# Patient Record
Sex: Male | Born: 1950 | Race: Black or African American | Hispanic: No | Marital: Single | Smoking: Former smoker
Health system: Southern US, Community
[De-identification: ages and names within clinical notes are randomized; demographics above are authoritative.]

## PROBLEM LIST (undated history)

## (undated) DIAGNOSIS — C229 Malignant neoplasm of liver, not specified as primary or secondary: Secondary | ICD-10-CM

---

## 2011-06-25 ENCOUNTER — Ambulatory Visit: Payer: Self-pay | Admitting: Adult Health

## 2011-07-04 ENCOUNTER — Inpatient Hospital Stay: Payer: Self-pay | Admitting: Internal Medicine

## 2011-07-26 ENCOUNTER — Inpatient Hospital Stay: Payer: Self-pay | Admitting: Internal Medicine

## 2012-02-24 ENCOUNTER — Ambulatory Visit: Payer: Self-pay

## 2012-04-24 IMAGING — CR DG CHEST 2V
1 series · 2 of 2 positions shown · non-contrast
Comparison: none

REASON FOR EXAM: cva sx
COMMENTS:

[Series 1: w chest pa · 0.14mm/px · 2 of 2 slices shown]
[im 1/2]
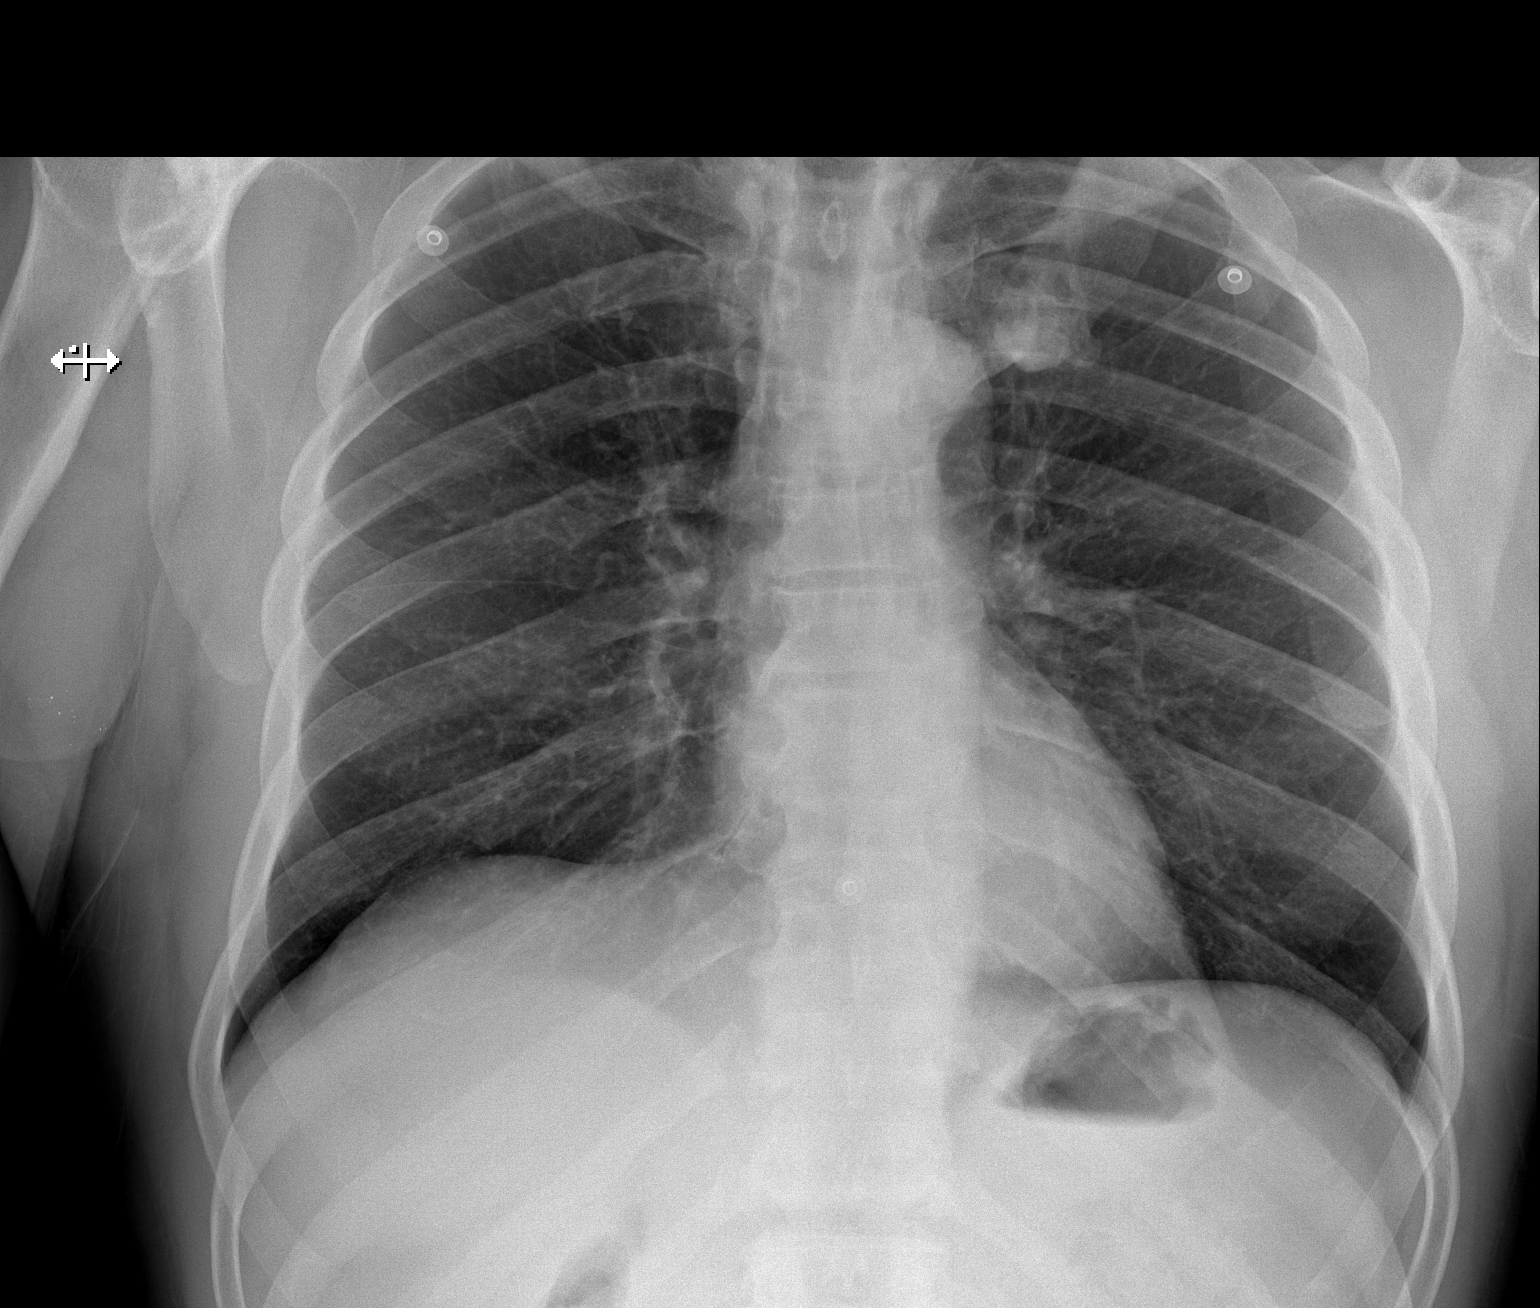
[im 2/2]
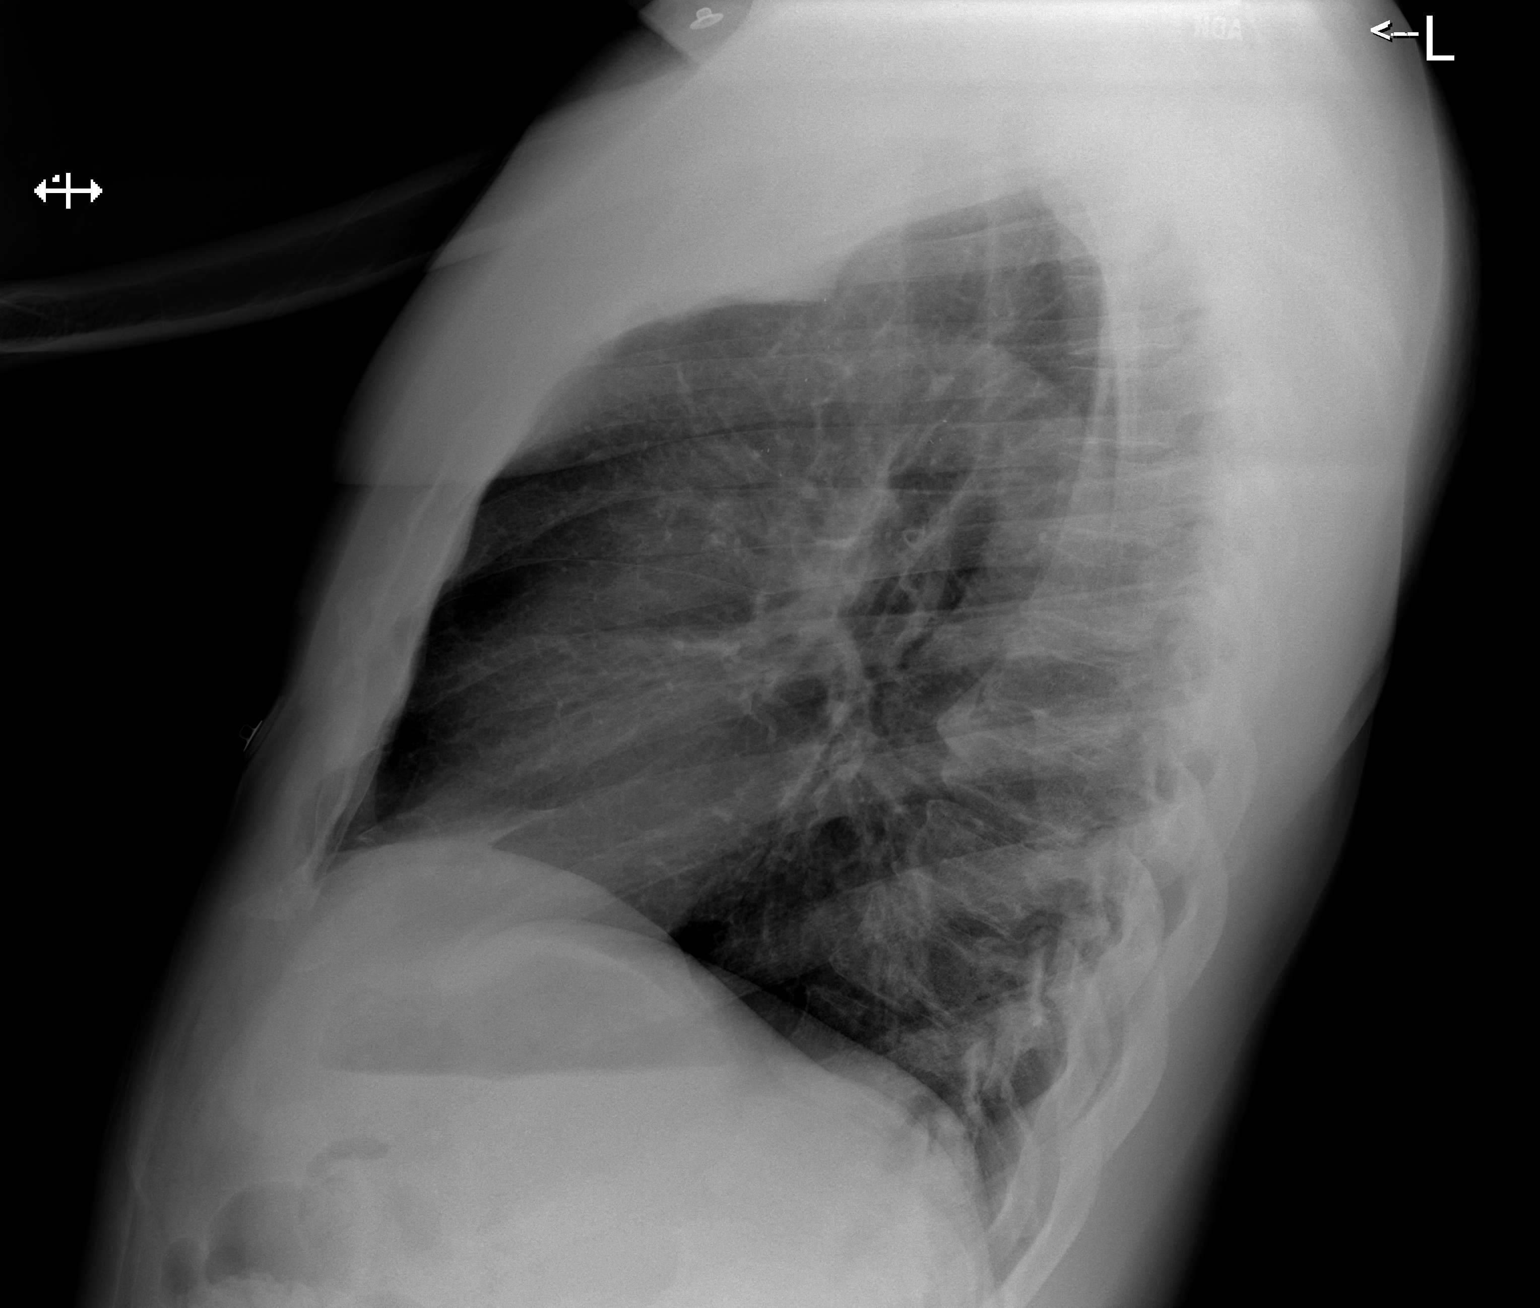

[2 of 2 positions shown; findings below may reference images not displayed]

PROCEDURE:     DXR - DXR CHEST PA (OR AP) AND LATERAL  - July 26, 2011  [DATE]

RESULT:     Comparison is made to the prior exam of 06/25/2011. The lung
fields are clear. No pneumonia, pneumothorax or pleural effusion is seen.
Heart size is normal. The mediastinal and osseous structures show no acute
changes.
IMPRESSION: 1.     No acute changes are identified.

## 2015-02-15 ENCOUNTER — Emergency Department: Payer: Medicaid Other

## 2015-02-15 ENCOUNTER — Inpatient Hospital Stay: Admit: 2015-02-15 | Payer: Medicaid Other

## 2015-02-15 ENCOUNTER — Inpatient Hospital Stay
Admission: EM | Admit: 2015-02-15 | Discharge: 2015-02-19 | DRG: 871 | Disposition: A | Discharge status: Hospice | Payer: Medicaid Other | Attending: Internal Medicine | Admitting: Internal Medicine

## 2015-02-15 ENCOUNTER — Encounter: Payer: Self-pay | Admitting: *Deleted

## 2015-02-15 DIAGNOSIS — C220 Liver cell carcinoma: Secondary | ICD-10-CM | POA: Diagnosis present

## 2015-02-15 DIAGNOSIS — W19XXXA Unspecified fall, initial encounter: Secondary | ICD-10-CM | POA: Diagnosis present

## 2015-02-15 DIAGNOSIS — D638 Anemia in other chronic diseases classified elsewhere: Secondary | ICD-10-CM | POA: Diagnosis present

## 2015-02-15 DIAGNOSIS — Z66 Do not resuscitate: Secondary | ICD-10-CM | POA: Diagnosis present

## 2015-02-15 DIAGNOSIS — B961 Klebsiella pneumoniae [K. pneumoniae] as the cause of diseases classified elsewhere: Secondary | ICD-10-CM | POA: Diagnosis present

## 2015-02-15 DIAGNOSIS — K7460 Unspecified cirrhosis of liver: Secondary | ICD-10-CM | POA: Diagnosis present

## 2015-02-15 DIAGNOSIS — K7682 Hepatic encephalopathy: Secondary | ICD-10-CM

## 2015-02-15 DIAGNOSIS — R188 Other ascites: Secondary | ICD-10-CM | POA: Diagnosis present

## 2015-02-15 DIAGNOSIS — E872 Acidosis: Secondary | ICD-10-CM | POA: Diagnosis present

## 2015-02-15 DIAGNOSIS — Z515 Encounter for palliative care: Secondary | ICD-10-CM | POA: Diagnosis not present

## 2015-02-15 DIAGNOSIS — E876 Hypokalemia: Secondary | ICD-10-CM | POA: Diagnosis present

## 2015-02-15 DIAGNOSIS — E119 Type 2 diabetes mellitus without complications: Secondary | ICD-10-CM | POA: Diagnosis present

## 2015-02-15 DIAGNOSIS — Z8505 Personal history of malignant neoplasm of liver: Secondary | ICD-10-CM

## 2015-02-15 DIAGNOSIS — K766 Portal hypertension: Secondary | ICD-10-CM | POA: Diagnosis present

## 2015-02-15 DIAGNOSIS — N390 Urinary tract infection, site not specified: Secondary | ICD-10-CM | POA: Diagnosis present

## 2015-02-15 DIAGNOSIS — Z87891 Personal history of nicotine dependence: Secondary | ICD-10-CM | POA: Diagnosis not present

## 2015-02-15 DIAGNOSIS — K7290 Hepatic failure, unspecified without coma: Secondary | ICD-10-CM | POA: Diagnosis present

## 2015-02-15 DIAGNOSIS — K652 Spontaneous bacterial peritonitis: Secondary | ICD-10-CM | POA: Diagnosis present

## 2015-02-15 DIAGNOSIS — A047 Enterocolitis due to Clostridium difficile: Secondary | ICD-10-CM | POA: Diagnosis present

## 2015-02-15 DIAGNOSIS — E871 Hypo-osmolality and hyponatremia: Secondary | ICD-10-CM | POA: Diagnosis present

## 2015-02-15 DIAGNOSIS — R161 Splenomegaly, not elsewhere classified: Secondary | ICD-10-CM | POA: Diagnosis present

## 2015-02-15 DIAGNOSIS — A419 Sepsis, unspecified organism: Secondary | ICD-10-CM | POA: Diagnosis present

## 2015-02-15 DIAGNOSIS — E440 Moderate protein-calorie malnutrition: Secondary | ICD-10-CM | POA: Insufficient documentation

## 2015-02-15 DIAGNOSIS — I81 Portal vein thrombosis: Secondary | ICD-10-CM | POA: Diagnosis present

## 2015-02-15 HISTORY — DX: Malignant neoplasm of liver, not specified as primary or secondary: C22.9

## 2015-02-15 LAB — URINALYSIS COMPLETE WITH MICROSCOPIC (ARMC ONLY)
GLUCOSE, UA: NEGATIVE mg/dL
Ketones, ur: NEGATIVE mg/dL
NITRITE: NEGATIVE
Protein, ur: 30 mg/dL — AB
Specific Gravity, Urine: 1.024 (ref 1.005–1.030)
pH: 5 (ref 5.0–8.0)

## 2015-02-15 LAB — COMPREHENSIVE METABOLIC PANEL
ALBUMIN: 1.9 g/dL — AB (ref 3.5–5.0)
ALK PHOS: 260 U/L — AB (ref 38–126)
ALT: 29 U/L (ref 17–63)
AST: 164 U/L — ABNORMAL HIGH (ref 15–41)
Anion gap: 12 (ref 5–15)
BUN: 14 mg/dL (ref 6–20)
CALCIUM: 8.5 mg/dL — AB (ref 8.9–10.3)
CO2: 17 mmol/L — AB (ref 22–32)
CREATININE: 0.88 mg/dL (ref 0.61–1.24)
Chloride: 98 mmol/L — ABNORMAL LOW (ref 101–111)
GFR calc Af Amer: >60 mL/min (ref >60)
GFR calc non Af Amer: >60 mL/min (ref >60)
GLUCOSE: 120 mg/dL — AB (ref 65–99)
POTASSIUM: 4.7 mmol/L (ref 3.5–5.1)
Sodium: 127 mmol/L — ABNORMAL LOW (ref 135–145)
Total Bilirubin: 9.8 mg/dL — ABNORMAL HIGH (ref 0.3–1.2)
Total Protein: 9.4 g/dL — ABNORMAL HIGH (ref 6.5–8.1)

## 2015-02-15 LAB — CBC
HCT: 30.7 % — ABNORMAL LOW (ref 40.0–52.0)
Hemoglobin: 10.3 g/dL — ABNORMAL LOW (ref 13.0–18.0)
MCH: 31.4 pg (ref 26.0–34.0)
MCHC: 33.5 g/dL (ref 32.0–36.0)
MCV: 93.6 fL (ref 80.0–100.0)
Platelets: 242 10*3/uL (ref 150–440)
RBC: 3.28 MIL/uL — ABNORMAL LOW (ref 4.40–5.90)
RDW: 18.5 % — AB (ref 11.5–14.5)
WBC: 19.2 10*3/uL — ABNORMAL HIGH (ref 3.8–10.6)

## 2015-02-15 LAB — TROPONIN I
TROPONIN I: 0.11 ng/mL — AB (ref <0.031)
Troponin I: 0.12 ng/mL — ABNORMAL HIGH (ref <0.031)

## 2015-02-15 LAB — AMMONIA: AMMONIA: 54 umol/L — AB (ref 9–35)

## 2015-02-15 LAB — LACTIC ACID, PLASMA
LACTIC ACID, VENOUS: 4 mmol/L — AB (ref 0.5–2.0)
Lactic Acid, Venous: 5 mmol/L (ref 0.5–2.0)

## 2015-02-15 LAB — CLOSTRIDIUM DIFFICILE BY PCR: Toxigenic C. Difficile by PCR: POSITIVE — AB

## 2015-02-15 MED ORDER — SODIUM CHLORIDE 0.9 % IV BOLUS (SEPSIS)
1000.0000 mL | Freq: Once | INTRAVENOUS | Status: AC
  Administered 2015-02-15: 1000 mL via INTRAVENOUS

## 2015-02-15 MED ORDER — ENOXAPARIN SODIUM 40 MG/0.4ML ~~LOC~~ SOLN
40.0000 mg | SUBCUTANEOUS | Status: DC
  Administered 2015-02-15 – 2015-02-16 (×2): 40 mg via SUBCUTANEOUS
  Filled 2015-02-15 (×2): qty 0.4

## 2015-02-15 MED ORDER — LACTULOSE 10 GM/15ML PO SOLN
ORAL | Status: AC
  Administered 2015-02-15: 20 g via ORAL
  Filled 2015-02-15: qty 30

## 2015-02-15 MED ORDER — PIPERACILLIN-TAZOBACTAM 3.375 G IVPB 30 MIN: 3.3750 g | Freq: Four times a day (QID) | INTRAVENOUS | Status: DC

## 2015-02-15 MED ORDER — ONDANSETRON HCL 4 MG/2ML IJ SOLN: 4.0000 mg | Freq: Four times a day (QID) | INTRAMUSCULAR | Status: DC | PRN

## 2015-02-15 MED ORDER — VANCOMYCIN HCL IN DEXTROSE 1-5 GM/200ML-% IV SOLN
INTRAVENOUS | Status: AC
  Filled 2015-02-15: qty 200

## 2015-02-15 MED ORDER — VANCOMYCIN HCL IN DEXTROSE 1-5 GM/200ML-% IV SOLN
1000.0000 mg | Freq: Once | INTRAVENOUS | Status: AC
  Administered 2015-02-15: 1000 mg via INTRAVENOUS
  Filled 2015-02-15: qty 200

## 2015-02-15 MED ORDER — VANCOMYCIN HCL IN DEXTROSE 1-5 GM/200ML-% IV SOLN
1000.0000 mg | Freq: Once | INTRAVENOUS | Status: AC
  Administered 2015-02-15: 1000 mg via INTRAVENOUS

## 2015-02-15 MED ORDER — VANCOMYCIN HCL IN DEXTROSE 1-5 GM/200ML-% IV SOLN: 1000.0000 mg | INTRAVENOUS | Status: DC

## 2015-02-15 MED ORDER — VANCOMYCIN HCL IN DEXTROSE 1-5 GM/200ML-% IV SOLN
1000.0000 mg | Freq: Three times a day (TID) | INTRAVENOUS | Status: DC
  Administered 2015-02-16 (×2): 1000 mg via INTRAVENOUS
  Filled 2015-02-15 (×6): qty 200

## 2015-02-15 MED ORDER — PIPERACILLIN-TAZOBACTAM 3.375 G IVPB
3.3750 g | Freq: Once | INTRAVENOUS | Status: AC
  Administered 2015-02-15: 3.375 g via INTRAVENOUS

## 2015-02-15 MED ORDER — SENNOSIDES-DOCUSATE SODIUM 8.6-50 MG PO TABS: 1.0000 | ORAL_TABLET | Freq: Every evening | ORAL | Status: DC | PRN

## 2015-02-15 MED ORDER — ACETAMINOPHEN 325 MG PO TABS: 650.0000 mg | ORAL_TABLET | Freq: Four times a day (QID) | ORAL | Status: DC | PRN

## 2015-02-15 MED ORDER — LACTULOSE 10 GM/15ML PO SOLN
20.0000 g | Freq: Three times a day (TID) | ORAL | Status: DC
  Administered 2015-02-15 (×2): 20 g via ORAL
  Filled 2015-02-15: qty 30

## 2015-02-15 MED ORDER — SODIUM CHLORIDE 0.9 % IV SOLN
1000.0000 mL | Freq: Once | INTRAVENOUS | Status: AC
  Administered 2015-02-15: 1000 mL via INTRAVENOUS

## 2015-02-15 MED ORDER — NADOLOL 20 MG PO TABS
20.0000 mg | ORAL_TABLET | Freq: Every day | ORAL | Status: DC
  Administered 2015-02-15 – 2015-02-19 (×5): 20 mg via ORAL
  Filled 2015-02-15 (×5): qty 1

## 2015-02-15 MED ORDER — ACETAMINOPHEN 650 MG RE SUPP: 650.0000 mg | Freq: Four times a day (QID) | RECTAL | Status: DC | PRN

## 2015-02-15 MED ORDER — PIPERACILLIN-TAZOBACTAM 3.375 G IVPB
INTRAVENOUS | Status: AC
  Filled 2015-02-15: qty 50

## 2015-02-15 MED ORDER — ONDANSETRON HCL 4 MG PO TABS: 4.0000 mg | ORAL_TABLET | Freq: Four times a day (QID) | ORAL | Status: DC | PRN

## 2015-02-15 MED ORDER — PIPERACILLIN-TAZOBACTAM 3.375 G IVPB
3.3750 g | Freq: Three times a day (TID) | INTRAVENOUS | Status: DC
  Administered 2015-02-15 – 2015-02-17 (×5): 3.375 g via INTRAVENOUS
  Filled 2015-02-15 (×10): qty 50

## 2015-02-15 MED ORDER — SODIUM CHLORIDE 0.9 % IV SOLN
INTRAVENOUS | Status: AC
  Administered 2015-02-15 – 2015-02-16 (×3): via INTRAVENOUS

## 2015-02-15 NOTE — Progress Notes (Signed)
Patient admitted to unit. Oriented to room, call bell, and staff. Bed in lowest position. Fall safety plan reviewed. Focused assessment to Epic due to change of  Shift time. No complaints at this time. Family present for admission questions.  David Stokes

## 2015-02-15 NOTE — ED Notes (Signed)
Patient transported to X-ray, family waiting in room

## 2015-02-15 NOTE — ED Notes (Signed)
Patient refused In and out cath for urine specimen. Urinal at bedside.

## 2015-02-15 NOTE — ED Notes (Signed)
Condom cath placed. Patient tolerated procedure well.

## 2015-02-15 NOTE — Progress Notes (Addendum)
ANTIBIOTIC CONSULT NOTE - INITIAL  Pharmacy Consult for Vancomycin and Zosyn dosing  Indication: Sepsis  No Known Allergies  Patient Measurements: Height: 6' (182.9 cm) Weight: 180 lb (81.647 kg) IBW/kg (Calculated) : 77.6 Adjusted Body Weight: 79.2 kg  Vital Signs: Temp: 98.3 F (36.8 C) (05/20 1830) Temp Source: Oral (05/20 1830) BP: 135/72 mmHg (05/20 1830) Pulse Rate: 80 (05/20 1830) Intake/Output from previous day:   Intake/Output from this shift:    Labs:  Recent Labs  02/15/15 1005  WBC 19.2*  HGB 10.3*  PLT 242  CREATININE 0.88   Estimated Creatinine Clearance: 95.5 mL/min (by C-G formula based on Cr of 0.88). No results for input(s): VANCOTROUGH, VANCOPEAK, VANCORANDOM, GENTTROUGH, GENTPEAK, GENTRANDOM, TOBRATROUGH, TOBRAPEAK, TOBRARND, AMIKACINPEAK, AMIKACINTROU, AMIKACIN in the last 72 hours.   Microbiology: No results found for this or any previous visit (from the past 720 hour(s)).  Medical History: Past Medical History  Diagnosis Date  . Liver cancer     Medications:  Anti-infectives    Start     Dose/Rate Route Frequency Ordered Stop   02/16/15 0600  vancomycin (VANCOCIN) IVPB 1000 mg/200 mL premix     1,000 mg 200 mL/hr over 60 Minutes Intravenous Every 8 hours 02/15/15 1840     02/15/15 2200  vancomycin (VANCOCIN) IVPB 1000 mg/200 mL premix     1,000 mg 200 mL/hr over 60 Minutes Intravenous  Once 02/15/15 1840     02/15/15 2100  piperacillin-tazobactam (ZOSYN) IVPB 3.375 g     3.375 g 12.5 mL/hr over 240 Minutes Intravenous 3 times per day 02/15/15 1843     02/15/15 1845  vancomycin (VANCOCIN) IVPB 1000 mg/200 mL premix  Status:  Discontinued     1,000 mg 200 mL/hr over 60 Minutes Intravenous Every 24 hours 02/15/15 1830 02/15/15 1838   02/15/15 1845  piperacillin-tazobactam (ZOSYN) IVPB 3.375 g  Status:  Discontinued     3.375 g 100 mL/hr over 30 Minutes Intravenous 4 times per day 02/15/15 1830 02/15/15 1841   02/15/15 1200   vancomycin (VANCOCIN) IVPB 1000 mg/200 mL premix     1,000 mg 200 mL/hr over 60 Minutes Intravenous  Once 02/15/15 1159 02/15/15 1702   02/15/15 1200  piperacillin-tazobactam (ZOSYN) IVPB 3.375 g     3.375 g 12.5 mL/hr over 240 Minutes Intravenous  Once 02/15/15 1159 02/15/15 1953     Assessment: 64 year old male ordered Vancomycin and Zosyn for Sepsis.    Goal of Therapy:  Vancomycin trough level 15-20 mcg/ml  Plan:  Measure antibiotic drug levels at steady state Follow up culture results Vancomycin 1g given in ED ~ 16:00.  Ordered next dose for 5/20 at 22:00 for stacked dosing. Continue with Vancomycin 1g IV Q8H beginning 5/21 at 0600.    Vancomycin Trough ordered for 5/21 at 21:30.  Zosyn 3.375g EI Q8H ordered.    Katsoudas,Christine K, RPh 02/15/2015,6:45 PM   5/21 PM Vanc level  23. Changed to 1 gram q 12 hours. Level ordered before 3rd new dose to evaluate regimen.  Sim Boast, PharmD, BCPS  02/16/2015

## 2015-02-15 NOTE — Progress Notes (Signed)
Patient's  Stool came positive for C'diff and contact precaution initiated, MD aware. Lactic acid also came in 4.0 and DR. Konidena notified of the results, no new order.
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
## 2015-02-15 NOTE — ED Provider Notes (Signed)
[facility] Emergency Department Provider Note  ____________________________________________  Time seen: 9:50 AM  I have reviewed the triage vital signs and the nursing notes.   HISTORY  Chief Complaint Fall  Slow to respond but seems to answer appropriately    HPI David Stokes is a 64 y.o. male who presents after a fall. Per EMS patient was walking he got dizzy and fell forward but denies hitting his head. He initially complained of left upper leg pain but is able to move his left leg well and says it feels better now. Relatively recent diagnosis of liver CA for which she has not started treatment. He denies fevers chills. Denies cough area no abdominal pain.     No past medical history on file.  There are no active problems to display for this patient.   No past surgical history on file.  No current outpatient prescriptions on file.  Allergies Review of patient's allergies indicates not on file.  No family history on file.  Social History History  Substance Use Topics  . Smoking status: Not on file  . Smokeless tobacco: Not on file  . Alcohol Use: Not on file    Review of Systems  Constitutional: Negative for fever. Eyes: Negative for visual changes. ENT: Negative for sore throat. Cardiovascular: Negative for chest pain. Respiratory: Negative for shortness of breath. Gastrointestinal: Negative for abdominal pain, vomiting and diarrhea. Genitourinary: Negative for dysuria. Musculoskeletal: Negative for back pain. Skin: Negative for rash. Neurological: Negative for headaches, focal weakness or numbness.   10-point ROS otherwise negative.  ____________________________________________   PHYSICAL EXAM:  VITAL SIGNS: ED Triage Vitals  Enc Vitals Group     BP 02/15/15 0957 154/115 mmHg     Pulse Rate 02/15/15 0957 105     Resp 02/15/15 0957 20     Temp 02/15/15 0957 99.3 F (37.4 C)     Temp src --      SpO2 02/15/15  0957 99 %     Weight 02/15/15 0957 180 lb (81.647 kg)     Height 02/15/15 0957 6' (1.829 m)     Head Cir --      Peak Flow --      Pain Score --      Pain Loc --      Pain Edu? --      Excl. in [place]? --      Constitutional: Alert and oriented. Well appearing and in no distress. Eyes: Conjunctivae are icteric. PERRL. Normal extraocular movements. ENT   Head: Normocephalic and atraumatic.   Nose: No congestion/rhinnorhea.   Mouth/Throat: Mucous membranes are moist.  Hematological/Lymphatic/Immunilogical: No cervical lymphadenopathy. Cardiovascular: tachycardia ,regular rhythm. Normal and symmetric distal pulses are present in all extremities. No murmurs, rubs, or gallops. Respiratory: Normal respiratory effort without tachypnea nor retractions. Breath sounds are clear and equal bilaterally. No wheezes/rales/rhonchi. Gastrointestinal: Soft and nontender. No distention. There is no CVA tenderness. Genitourinary: deferred Musculoskeletal: Nontender with normal range of motion in all extremities. No joint effusions.  No lower extremity tenderness nor edema. Able to range both extremities very well Neurologic:  Normal speech and language. No gross focal neurologic deficits are appreciated. Speech is normal.  Skin:  Skin is warm, dry and intact. No rash noted. Psychiatric: Mood and affect are normal. Speech and behavior are normal. Patient exhibits appropriate insight and judgment.  ____________________________________________    LABS (pertinent positives/negatives)  Significant for elevated ammonia and elevated white blood cell count and hyponatremia and  elevated troponin  ____________________________________________   EKG  ED ECG REPORT I, Lavonia Drafts, the attending physician, personally viewed and interpreted this ECG.   Date: 02/15/2015  EKG Time: 2:30 PM  Rate: 102  Rhythm: sinus tachycardia  Axis: Normal  Intervals:none  ST&T Change:  Nonspecific   ____________________________________________    RADIOLOGY None  ____________________________________________   PROCEDURES  Procedure(s) performed: None  Critical Care performed: None  ____________________________________________   INITIAL IMPRESSION / ASSESSMENT AND PLAN / ED COURSE  Pertinent labs & imaging results that were available during my care of the patient were reviewed by me and considered in my medical decision making (see chart for details).  On arrival patient ultimately well-appearing. No acute injury except for some mild pelvic discomfort. However labs concerning for SIRS versus sepsis. Unable to establish clear source of infection however her elevated lactate elevated white blood cell count is very concerning and hence I have covered him broadly with vancomycin and Zosyn. I will admit him to the hospitalist service for further evaluation  ____________________________________________   FINAL CLINICAL IMPRESSION(S) / ED DIAGNOSES  Final diagnoses:  Sepsis, due to unspecified organism     Lavonia Drafts, MD 02/15/15 1451

## 2015-02-15 NOTE — ED Notes (Signed)
Pt returned from xray, resting in bed in no distress

## 2015-02-15 NOTE — ED Notes (Signed)
Pt arrives via EMS with a fall, pt states he got dizzy and weak and fell, pt recently diagnosed with liver cancer but hasn't received his first tx yet, pt complains of left upper thigh pain

## 2015-02-15 NOTE — ED Notes (Signed)
Lovena Le RN called and asked to hold patient while room is readied by maintenance.

## 2015-02-15 NOTE — ED Notes (Signed)
Report called to [employer] on floor.

## 2015-02-16 ENCOUNTER — Inpatient Hospital Stay
Admit: 2015-02-16 | Discharge: 2015-02-16 | Disposition: A | Discharge status: Home / Self Care | Payer: Medicaid Other | Attending: Internal Medicine | Admitting: Internal Medicine

## 2015-02-16 DIAGNOSIS — E440 Moderate protein-calorie malnutrition: Secondary | ICD-10-CM | POA: Insufficient documentation

## 2015-02-16 LAB — VANCOMYCIN, TROUGH: Vancomycin Tr: 23 ug/mL (ref 10–20)

## 2015-02-16 LAB — CBC
HCT: 24.1 % — ABNORMAL LOW (ref 40.0–52.0)
HCT: 25.6 % — ABNORMAL LOW (ref 40.0–52.0)
Hemoglobin: 8.2 g/dL — ABNORMAL LOW (ref 13.0–18.0)
Hemoglobin: 8.6 g/dL — ABNORMAL LOW (ref 13.0–18.0)
MCH: 31.6 pg (ref 26.0–34.0)
MCH: 31.2 pg (ref 26.0–34.0)
MCHC: 33.5 g/dL (ref 32.0–36.0)
MCHC: 33.9 g/dL (ref 32.0–36.0)
MCV: 93.2 fL (ref 80.0–100.0)
MCV: 93.2 fL (ref 80.0–100.0)
Platelets: 159 10*3/uL (ref 150–440)
Platelets: 160 10*3/uL (ref 150–440)
RBC: 2.58 MIL/uL — AB (ref 4.40–5.90)
RBC: 2.75 MIL/uL — ABNORMAL LOW (ref 4.40–5.90)
RDW: 18.7 % — ABNORMAL HIGH (ref 11.5–14.5)
RDW: 18.9 % — ABNORMAL HIGH (ref 11.5–14.5)
WBC: 13.3 10*3/uL — ABNORMAL HIGH (ref 3.8–10.6)
WBC: 14.6 10*3/uL — ABNORMAL HIGH (ref 3.8–10.6)

## 2015-02-16 LAB — HEPATIC FUNCTION PANEL
ALBUMIN: 1.5 g/dL — AB (ref 3.5–5.0)
ALT: 24 U/L (ref 17–63)
AST: 128 U/L — ABNORMAL HIGH (ref 15–41)
Alkaline Phosphatase: 199 U/L — ABNORMAL HIGH (ref 38–126)
Bilirubin, Direct: 5.1 mg/dL — ABNORMAL HIGH (ref 0.1–0.5)
Indirect Bilirubin: 2.9 mg/dL — ABNORMAL HIGH (ref 0.3–0.9)
Total Bilirubin: 8 mg/dL — ABNORMAL HIGH (ref 0.3–1.2)
Total Protein: 7.7 g/dL (ref 6.5–8.1)

## 2015-02-16 LAB — BASIC METABOLIC PANEL
ANION GAP: 4 — AB (ref 5–15)
BUN: 14 mg/dL (ref 6–20)
CHLORIDE: 105 mmol/L (ref 101–111)
CO2: 19 mmol/L — AB (ref 22–32)
CREATININE: 0.71 mg/dL (ref 0.61–1.24)
Calcium: 7.6 mg/dL — ABNORMAL LOW (ref 8.9–10.3)
GFR calc Af Amer: >60 mL/min (ref >60)
GLUCOSE: 107 mg/dL — AB (ref 65–99)
POTASSIUM: 4.5 mmol/L (ref 3.5–5.1)
SODIUM: 128 mmol/L — AB (ref 135–145)

## 2015-02-16 LAB — TROPONIN I
TROPONIN I: 0.1 ng/mL — AB (ref <0.031)
Troponin I: 0.09 ng/mL — ABNORMAL HIGH (ref <0.031)

## 2015-02-16 LAB — CREATININE, SERUM
Creatinine, Ser: 0.8 mg/dL (ref 0.61–1.24)
GFR calc Af Amer: >60 mL/min (ref >60)

## 2015-02-16 LAB — MAGNESIUM: MAGNESIUM: 1.7 mg/dL (ref 1.7–2.4)

## 2015-02-16 LAB — C DIFFICILE QUICK SCREEN W PCR REFLEX
C DIFFICLE (CDIFF) ANTIGEN: POSITIVE
C Diff toxin: NEGATIVE

## 2015-02-16 MED ORDER — ENSURE ENLIVE PO LIQD
237.0000 mL | Freq: Two times a day (BID) | ORAL | Status: DC
  Administered 2015-02-16 – 2015-02-18 (×5): 237 mL via ORAL

## 2015-02-16 MED ORDER — VANCOMYCIN HCL IN DEXTROSE 1-5 GM/200ML-% IV SOLN
1000.0000 mg | Freq: Two times a day (BID) | INTRAVENOUS | Status: DC
  Administered 2015-02-17: 1000 mg via INTRAVENOUS
  Filled 2015-02-16 (×2): qty 200

## 2015-02-16 MED ORDER — METRONIDAZOLE IN NACL 5-0.79 MG/ML-% IV SOLN
500.0000 mg | Freq: Four times a day (QID) | INTRAVENOUS | Status: DC
Start: 2015-02-16 — End: 2015-02-18
  Administered 2015-02-16 – 2015-02-18 (×10): 500 mg via INTRAVENOUS
  Filled 2015-02-16 (×13): qty 100

## 2015-02-16 NOTE — Evaluation (Signed)
Physical Therapy Evaluation Patient Details Name: David Stokes MRN: [id number] DOB: 06-24-1951 Today's Date: 02/16/2015   History of Present Illness  64 year old male patient with recently diagnosed With hepato Cellular cancer with jaundice, portal vein thrombosis comes in with fall, found to have clinical sepsis with lactic acid elevation up to 5, WBC 19. Patient is asymptomatic except burning urination.  Clinical Impression  Pt presents with decrease strength throughout effecting pt's ability to ambulate and maintain balance.  Pt required 1 person assist to get to bathroom and needed one person assist to maintain standing balance during hygiene.  Pt with poor standing endurance needing seated rest breaks due to muscle fatigue.  Pt incontinent of stool at this time and needs Max for changing gown and hygiene.  Pt has family members at home to assist with his care after discharge.  Rec HHPT.  Pt would benefit from acute care PT to address objective findings.    Follow Up Recommendations Home health PT    Equipment Recommendations  Rolling walker with 5" wheels    Recommendations for Other Services       Precautions / Restrictions Precautions Precautions: Fall Precaution Comments: weakness Restrictions Other Position/Activity Restrictions: enteric precautions c-diff positive      Mobility  Bed Mobility Overal bed mobility: Needs Assistance Bed Mobility: Supine to Sit;Sit to Supine     Supine to sit: Min guard;HOB elevated Sit to supine: Min guard;HOB elevated   General bed mobility comments: Slow, extra time needed.  Transfers Overall transfer level: Modified independent   Transfers: Sit to/from Stand Sit to Stand: Min guard            Ambulation/Gait Ambulation/Gait assistance: Min guard Ambulation Distance (Feet): 25 Feet Assistive device: 1 person hand held assist Gait Pattern/deviations: Step-through pattern;Wide base of support Gait velocity: slow    General Gait Details: Slow, labored gait with decreased balance, widened base of support  Stairs            Wheelchair Mobility    Modified Rankin (Stroke Patients Only)       Balance Overall balance assessment: Needs assistance Sitting-balance support: Single extremity supported Sitting balance-Leahy Scale: Good     Standing balance support: Single extremity supported Standing balance-Leahy Scale: Fair                               Pertinent Vitals/Pain Pain Assessment: No/denies pain    Home Living Family/patient expects to be discharged to:: Private residence Living Arrangements: Children Available Help at Discharge: Family Type of Home: House Home Access: Stairs to enter Entrance Stairs-Rails: Right Entrance Stairs-Number of Steps: 3 on walkway and 4 at the house Home Layout: One level Home Equipment: [place] - single point      Prior Function Level of Independence: Independent with assistive device(s)         Comments: Pt was able to walk outside the house prior to [facility] admission which was [facility].     Hand Dominance        Extremity/Trunk Assessment   Upper Extremity Assessment: Overall WFL for tasks assessed           Lower Extremity Assessment: Generalized weakness         Communication   Communication: No difficulties  Cognition Arousal/Alertness: Lethargic Behavior During Therapy: WFL for tasks assessed/performed;Flat affect Overall Cognitive Status: Within Functional Limits for tasks assessed  General Comments General comments (skin integrity, edema, etc.): jaundice    Exercises        Assessment/Plan    PT Assessment    PT Diagnosis Difficulty walking;Generalized weakness   PT Problem List    PT Treatment Interventions     PT Goals (Current goals can be found in the Care Plan section) Acute Rehab PT Goals Patient Stated Goal: Return home. PT Goal Formulation: With  patient Time For Goal Achievement: 02/23/15 Potential to Achieve Goals: Good    Frequency     Barriers to discharge        Co-evaluation               End of Session Equipment Utilized During Treatment: Gait belt Activity Tolerance: Patient limited by fatigue Patient left: in bed;with family/visitor present;with nursing/sitter in room           Time: 1435-1505 PT Time Calculation (min) (ACUTE ONLY): 30 min   Charges:   PT Evaluation $Initial PT Evaluation Tier I: 1 Procedure PT Treatments $Therapeutic Activity: 8-22 mins   PT G Codes:        Ulysess Witz A Zebedee Segundo 2015/03/05, 3:13 PM
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
## 2015-02-16 NOTE — Progress Notes (Signed)
Blood culture positive with 2 sets of aerobic and anaerobic bottle and gram negative rods. Dr. Lavetta Nielsen notified.

## 2015-02-16 NOTE — Progress Notes (Signed)
Initial Nutrition Assessment  DOCUMENTATION CODES:  Non-severe (moderate) malnutrition in context of chronic illness  INTERVENTION:   (Medical Nutrition Supplement: Recommend ensure enlive BID for added nutrition, pt has been drinking prior to admission)  NUTRITION DIAGNOSIS:  Inadequate oral intake related to acute illness as evidenced by other (see comment) (decreased intake for 2-3 days prior to admission).    GOAL:  Patient will meet greater than or equal to 90% of their needs    MONITOR:   (Energy intake, Digestive system)  REASON FOR ASSESSMENT:  Malnutrition Screening Tool    ASSESSMENT:  Pt admitted with sepsis, positive blood cultures, c-diff positive, recent fall  Past Medical History  Diagnosis Date  . Liver cancer    Electrolyte and Renal Profile:    Recent Labs Lab 02/15/15 1005 02/16/15 0001 02/16/15 0405  BUN 14  --  14  CREATININE 0.88 0.80 0.71  NA 127*  --  128*  K 4.7  --  4.5  MG  --   --  1.7   Protein Profile:   Recent Labs Lab 02/15/15 1005 02/16/15 0001  ALBUMIN 1.9* 1.5*   Medications: NS at 145ml/hr  Pt reports intake has been down for the past 2-3 days prior to admission.  Ate few bites of eggs and coffee this am for breakfast. Has been drinking ensure prior to admission    Height:  Ht Readings from Last 1 Encounters:  02/15/15 6' (1.829 m)    Weight:  Wt Readings from Last 1 Encounters:  02/16/15 180 lb 12.4 oz (82 kg)    Pt unsure of any weight loss. Reports UBW of 197 pounds but unsure when he was last at that weight.      Wt Readings from Last 10 Encounters:  02/16/15 180 lb 12.4 oz (82 kg)    Nutrition-Focused physical exam completed. Findings are most areas mild/moderate fat depletion, most areas mild/moderate muscle depletion, and no edema.     BMI:  Body mass index is 24.51 kg/(m^2).  Estimated Nutritional Needs:  Kcal:  BEE 1648 kcals (IF 1.0-1.3, AF 1.2) 8338-2505 kcals/d  Protein:   (1.2-1.5 g/d) 98-123 g/d  Fluid:  (25-84ml/kg) 2050-241ml/d  Skin:  Reviewed, no issues  Diet Order:  Diet regular Room service appropriate?: Yes; Fluid consistency:: Thin  EDUCATION NEEDS:  No education needs identified at this time   Intake/Output Summary (Last 24 hours) at 02/16/15 1152 Last data filed at 02/16/15 0830  Gross per 24 hour  Intake   1650 ml  Output      0 ml  Net   1650 ml    Last BM:  5/20, c-diff postive  MODERATE Care Level Hayla Hinger B. Zenia Resides, [place], [place] (pager)

## 2015-02-16 NOTE — Care Management Note (Signed)
Case Management Note  Patient Details  Name: David Stokes MRN: [id number] Date of Birth:             CM spoke with adult daughter David Stokes about discharge planning, and that PT has recommended home health PT. Discussed list of local providers and [facility] chose [employer]. Monica confirmed that David Stokes would be returning to his home after this hospital discharge.     Expected Discharge Date:                  Expected Discharge Plan:  [place]  In-House Referral:     Discharge planning Services  CM Consult  Post Acute Care Choice:  Home Health Choice offered to:  Adult Children, Patient  DME Arranged:    DME Agency:     HH Arranged:  PT HH Agency:  [place]  Status of Service:  In process, will continue to follow  Medicare Important Message Given:    Date Medicare IM Given:    Medicare IM give by:    Date Additional Medicare IM Given:    Additional Medicare Important Message give by:     If discussed at [place] of Stay Meetings, dates discussed:    Additional Comments:  David Saari A, RN 02/16/2015, 3:56 PM

## 2015-02-16 NOTE — Progress Notes (Signed)
*  PRELIMINARY RESULTS* Echocardiogram 2D Echocardiogram has been performed.  David Stokes 02/16/2015, 10:15 AM

## 2015-02-17 LAB — URINE CULTURE

## 2015-02-17 LAB — CULTURE, BLOOD (ROUTINE X 2)

## 2015-02-17 LAB — CBC
HCT: 23.8 % — ABNORMAL LOW (ref 40.0–52.0)
Hemoglobin: 8 g/dL — ABNORMAL LOW (ref 13.0–18.0)
MCH: 31.1 pg (ref 26.0–34.0)
MCHC: 33.4 g/dL (ref 32.0–36.0)
MCV: 93 fL (ref 80.0–100.0)
Platelets: 166 10*3/uL (ref 150–440)
RBC: 2.56 MIL/uL — ABNORMAL LOW (ref 4.40–5.90)
RDW: 17.8 % — ABNORMAL HIGH (ref 11.5–14.5)
WBC: 11.9 10*3/uL — ABNORMAL HIGH (ref 3.8–10.6)

## 2015-02-17 MED ORDER — CIPROFLOXACIN IN D5W 400 MG/200ML IV SOLN
400.0000 mg | Freq: Two times a day (BID) | INTRAVENOUS | Status: DC
  Administered 2015-02-17 – 2015-02-18 (×3): 400 mg via INTRAVENOUS
  Filled 2015-02-17 (×5): qty 200

## 2015-02-17 NOTE — Progress Notes (Addendum)
ANTIBIOTIC CONSULT NOTE - INITIAL  Pharmacy Consult for Ciprofloxacin Indication: Sepsis  No Known Allergies  Patient Measurements: Height: 6' (182.9 cm) Weight: 170 lb 9.6 oz (77.384 kg) IBW/kg (Calculated) : 77.6  Vital Signs: Temp: 97.8 F (36.6 C) (05/22 1143) Temp Source: Oral (05/22 1143) BP: 115/60 mmHg (05/22 1143) Pulse Rate: 61 (05/22 1143) Intake/Output from previous day: 05/21 0701 - 05/22 0700 In: 450 [IV Piggyback:450] Out: 501 [Urine:500; Stool:1] Intake/Output from this shift: Total I/O In: -  Out: 2 [Urine:1; Stool:1]  Labs:  Recent Labs  02/15/15 1005 02/16/15 0001 02/16/15 0405 02/17/15 0503  WBC 19.2* 14.6* 13.3* 11.9*  HGB 10.3* 8.6* 8.2* 8.0*  PLT 242 160 159 166  CREATININE 0.88 0.80 0.71  --    Estimated Creatinine Clearance: 104.8 mL/min (by C-G formula based on Cr of 0.71).  Recent Labs  02/16/15 2119  [facility] 23*     Microbiology: Recent Results (from the past 720 hour(s))  Culture, blood (routine x 2)     Status: None   Collection Time: 02/15/15 10:49 AM  Result Value Ref Range Status   Specimen Description BLOOD BLD  Final   Special Requests NONE  Final   Culture  Setup Time   Final    GRAM NEGATIVE RODS IN BOTH AEROBIC AND ANAEROBIC BOTTLES CRITICAL RESULT CALLED TO, READ BACK BY AND VERIFIED WITH: C/CHARLOTTEKYEI AT 2312 02/15/15.PMH CONFIRMED BY RWW    Culture   Final    KLEBSIELLA PNEUMONIAE IN BOTH AEROBIC AND ANAEROBIC BOTTLES    Report Status 02/17/2015 FINAL  Final   Organism ID, Bacteria KLEBSIELLA PNEUMONIAE  Final      Susceptibility   Klebsiella pneumoniae - MIC*    AMPICILLIN >=32 RESISTANT Resistant     CEFTAZIDIME <=1 SENSITIVE Sensitive     CEFAZOLIN <=4 SENSITIVE Sensitive     CEFTRIAXONE <=1 SENSITIVE Sensitive     CIPROFLOXACIN <=0.25 SENSITIVE Sensitive     GENTAMICIN <=1 SENSITIVE Sensitive     IMIPENEM <=0.25 SENSITIVE Sensitive     TRIMETH/SULFA <=20 SENSITIVE Sensitive     CEFOXITIN  <=4 SENSITIVE Sensitive     * KLEBSIELLA PNEUMONIAE  Culture, blood (routine x 2)     Status: None   Collection Time: 02/15/15 10:50 AM  Result Value Ref Range Status   Specimen Description BLOOD  Final   Special Requests NONE  Final   Culture  Setup Time   Final    GRAM NEGATIVE RODS IN BOTH AEROBIC AND ANAEROBIC BOTTLES CRITICAL RESULT CALLED TO, READ BACK BY AND VERIFIED WITH: CHARLOTTE KYEI AT 2312 02/15/15.PMH CONFIRMED BY RWW    Culture   Final    KLEBSIELLA PNEUMONIAE IN BOTH AEROBIC AND ANAEROBIC BOTTLES    Report Status 02/17/2015 FINAL  Final   Organism ID, Bacteria KLEBSIELLA PNEUMONIAE  Final      Susceptibility   Klebsiella pneumoniae - MIC*    AMPICILLIN >=32 RESISTANT Resistant     CEFTAZIDIME <=1 SENSITIVE Sensitive     CEFAZOLIN <=4 SENSITIVE Sensitive     CEFTRIAXONE <=1 SENSITIVE Sensitive     CIPROFLOXACIN <=0.25 SENSITIVE Sensitive     GENTAMICIN <=1 SENSITIVE Sensitive     IMIPENEM <=0.25 SENSITIVE Sensitive     TRIMETH/SULFA <=20 SENSITIVE Sensitive     CEFOXITIN <=4 SENSITIVE Sensitive     * KLEBSIELLA PNEUMONIAE  Urine culture     Status: None   Collection Time: 02/15/15  4:02 PM  Result Value Ref Range Status   Specimen  Description URINE, CLEAN CATCH  Final   Special Requests NONE  Final   Culture   Final    50,000 COLONIES/mL KLEBSIELLA PNEUMONIAE 10,000 COLONIES/mL STREPTOCOCCI, ALPHA HEMOLYTIC    Report Status 02/17/2015 FINAL  Final   Organism ID, Bacteria KLEBSIELLA PNEUMONIAE  Final      Susceptibility   Klebsiella pneumoniae - MIC*    AMPICILLIN >=32 RESISTANT Resistant     CEFTAZIDIME <=1 SENSITIVE Sensitive     CEFAZOLIN <=4 SENSITIVE Sensitive     CEFTRIAXONE <=1 SENSITIVE Sensitive     CIPROFLOXACIN <=0.25 SENSITIVE Sensitive     GENTAMICIN <=1 SENSITIVE Sensitive     IMIPENEM <=0.25 SENSITIVE Sensitive     TRIMETH/SULFA <=20 SENSITIVE Sensitive     CEFOXITIN <=4 SENSITIVE Sensitive     * 50,000 COLONIES/mL KLEBSIELLA PNEUMONIAE   C difficile quick scan w PCR reflex [facility])     Status: None   Collection Time: 02/15/15  6:32 PM  Result Value Ref Range Status   C Diff antigen POSITIVE  Final   C Diff toxin NEGATIVE  Final   C Diff interpretation   Corrected    Positive for toxigenic C. difficile, active toxin production not detected. Patient has toxigenic C. difficile organisms present in the bowel, but toxin was not detected. The patient may be a carrier or the level of toxin in the sample was below the limit  of detection. This information should be used in conjunction with the patient's clinical history when deciding on possible therapy.     Comment: CORRECTED ON 05/21 AT 1343: PREVIOUSLY REPORTED AS REFLEX TO PCR  Clostridium Difficile by PCR     Status: Abnormal   Collection Time: 02/15/15  6:32 PM  Result Value Ref Range Status   C difficile by pcr POSITIVE (A) NEGATIVE Final    Comment: CRITICAL RESULT CALLED TO, READ BACK BY AND VERIFIED WITH: C/CHARLOTTE KYEI AT 2034 02/15/15.PMH     Medical History: Past Medical History  Diagnosis Date  . Liver cancer     Medications:  Scheduled:  . ciprofloxacin  400 mg Intravenous Q12H  . enoxaparin (LOVENOX) injection  40 mg Subcutaneous Q24H  . feeding supplement (ENSURE ENLIVE)  237 mL Oral BID WC  . metronidazole  500 mg Intravenous Q6H  . nadolol  20 mg Oral Daily   Infusions:   Assessment: Patient is a 64 yo male with sepsis.  Klebsiella present in urine cultures and blood cultures.  Previously patient on Vancomycin and Zosyn but antibiotics narrowed to Cipro based on culture/sensitivities.  Pharmacy consulted to dose Cipro.  SCr: 0.71 (0.8), est CrCl~104 mL/min  Goal of Therapy:  Eradication of infection  Continue to follow renal function  Plan:  Will order Ciprofloxacin 400 mg IV q12h.    Pharmacy will continue to follow.  Murrell Converse, PharmD Clinical Pharmacist 02/17/2015

## 2015-02-17 NOTE — Progress Notes (Signed)
[place] at [place] NAME: David Stokes    MR#:  [id number]  DATE OF BIRTH:  1950/11/20  SUBJECTIVE:  CHIEF COMPLAINT:  No complaint except abdominal distension and the diarrhea with a watery stool twice Chief Complaint  Patient presents with  . Fall   Diarrhea multiple times. No abdominal pain , but Abdominal distention, no nausea or vomiting. REVIEW OF SYSTEMS:  CONSTITUTIONAL: No fever, fatigue or weakness.  EYES: No blurred or double vision.  EARS, NOSE, AND THROAT: No tinnitus or ear pain.  RESPIRATORY: No cough, shortness of breath, wheezing or hemoptysis.  CARDIOVASCULAR: No chest pain, orthopnea, edema.  GASTROINTESTINAL: Abdominal distention and diarrhea. No nausea, vomiting or abdominal pain.  GENITOURINARY: No dysuria, hematuria.  ENDOCRINE: No polyuria, nocturia,  HEMATOLOGY: No anemia, easy bruising or bleeding SKIN: No rash or lesion. MUSCULOSKELETAL: No joint pain or arthritis.   NEUROLOGIC: No tingling, numbness, weakness.  PSYCHIATRY: No anxiety or depression.   DRUG ALLERGIES:  No Known Allergies  VITALS:  Blood pressure 115/60, pulse 61, temperature 97.8 F (36.6 C), temperature source Oral, resp. rate 16, height 6' (1.829 m), weight 77.384 kg (170 lb 9.6 oz), SpO2 100 %.  PHYSICAL EXAMINATION:  GENERAL:  64 y.o.-year-old patient lying in the bed with no acute distress.  EYES: Pupils equal, round, reactive to light and accommodation. No scleral icterus. Extraocular muscles intact.  HEENT: Head atraumatic, normocephalic. Oropharynx and nasopharynx clear.  NECK:  Supple, no jugular venous distention. No thyroid enlargement, no tenderness.  LUNGS: Normal breath sounds bilaterally, no wheezing, rales,rhonchi or crepitation. No use of accessory muscles of respiration.  CARDIOVASCULAR: S1, S2 normal. No murmurs, rubs, or gallops.  ABDOMEN: Soft, nontender, but highly distended.  Bowel sounds present.  Positive for ascites signs. Unable to estimate when the patient has organomegaly or mass.  EXTREMITIES: No pedal edema, cyanosis, or clubbing.  NEUROLOGIC: Cranial nerves II through XII are intact. Muscle strength 5/5 in all extremities. Sensation intact. Gait not checked.  PSYCHIATRIC: The patient is alert and oriented x 3.  SKIN: No obvious rash, lesion, or ulcer. But has a jaundice.   LABORATORY PANEL:   CBC  Recent Labs Lab 02/17/15 0503  WBC 11.9*  HGB 8.0*  HCT 23.8*  PLT 166   ------------------------------------------------------------------------------------------------------------------  Chemistries   Recent Labs Lab 02/16/15 0001 02/16/15 0405  NA  --  128*  K  --  4.5  CL  --  105  CO2  --  19*  GLUCOSE  --  107*  BUN  --  14  CREATININE 0.80 0.71  CALCIUM  --  7.6*  MG  --  1.7  AST 128*  --   ALT 24  --   ALKPHOS 199*  --   BILITOT 8.0*  --    ------------------------------------------------------------------------------------------------------------------  Cardiac Enzymes  Recent Labs Lab 02/16/15 0405  TROPONINI 0.09*   ------------------------------------------------------------------------------------------------------------------  RADIOLOGY:  No results found.  EKG:   Orders placed or performed during the hospital encounter of 02/15/15  . EKG 12-Lead  . 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 Patient is asymptomatic except burning urination.   Sepsis with bacteremia:   Blood culture shows Klebsiella. Leukocytosis improved. Discontinue antibiotics broad-spectrum with IV vancomycin and Zosyn, and start Cipro.  Follow-up ID consult.  Patient has ascites and may have intra-abdominal source with possible SBP. F/u  IR guided  paracentesis for today. Hold lovenox.  C. difficile colitis: Stool test is positive for C. Difficile, continue Flagyl  Hyponatremia secondary to poor by mouth intake. Continue IV fluids, follow-up BMP  Fall and dizziness: Per physical therapy evaluation, need home health and physical therapy. hepatocellular Cancer with portal vein thrombosis:  not a candidate for any treatment as per [facility] oncology. Consult palliative care. Patient has terminal cancer as per [facility] note. Portal hypertension: continue his empiric beta blockers.  Hypomagnesemia: Magnesium supplement. Anemia of chronic disease: Hemoglobin is stable. Lactic acidosis: Treatment as above. Moderate malnutrition: Follow-up dietitian recommendation.     All the records are reviewed and case discussed with Care Management/Social Workerr. Management plans discussed with the patient, the patient's daughter and they are in agreement.  CODE STATUS: Full code  TOTAL TIME TAKING CARE OF THIS PATIENT: 52 minutes.   POSSIBLE D/C IN a few DAYS, DEPENDING ON CLINICAL CONDITION.   David Stokes M.D on 02/17/2015 at 1:49 PM  Between 7am to 6pm - Pager - [phone]  After 6pm go to [email or web address] - password [email or web address] [place] [facility]  Office  [phone]  CC: Primary care physician; David Noble, MD

## 2015-02-18 ENCOUNTER — Inpatient Hospital Stay: Payer: Medicaid Other

## 2015-02-18 DIAGNOSIS — R197 Diarrhea, unspecified: Secondary | ICD-10-CM

## 2015-02-18 DIAGNOSIS — Z87891 Personal history of nicotine dependence: Secondary | ICD-10-CM

## 2015-02-18 DIAGNOSIS — W19XXXS Unspecified fall, sequela: Secondary | ICD-10-CM

## 2015-02-18 DIAGNOSIS — D72829 Elevated white blood cell count, unspecified: Secondary | ICD-10-CM

## 2015-02-18 DIAGNOSIS — E872 Acidosis: Secondary | ICD-10-CM

## 2015-02-18 DIAGNOSIS — C220 Liver cell carcinoma: Secondary | ICD-10-CM

## 2015-02-18 DIAGNOSIS — Z79899 Other long term (current) drug therapy: Secondary | ICD-10-CM

## 2015-02-18 DIAGNOSIS — Z515 Encounter for palliative care: Secondary | ICD-10-CM

## 2015-02-18 DIAGNOSIS — I81 Portal vein thrombosis: Secondary | ICD-10-CM

## 2015-02-18 DIAGNOSIS — A414 Sepsis due to anaerobes: Secondary | ICD-10-CM

## 2015-02-18 LAB — CBC
HCT: 24.2 % — ABNORMAL LOW (ref 40.0–52.0)
Hemoglobin: 8.1 g/dL — ABNORMAL LOW (ref 13.0–18.0)
MCH: 31.3 pg (ref 26.0–34.0)
MCHC: 33.6 g/dL (ref 32.0–36.0)
MCV: 93.2 fL (ref 80.0–100.0)
Platelets: 176 10*3/uL (ref 150–440)
RBC: 2.6 MIL/uL — AB (ref 4.40–5.90)
RDW: 17.8 % — AB (ref 11.5–14.5)
WBC: 13.2 10*3/uL — AB (ref 3.8–10.6)

## 2015-02-18 LAB — BASIC METABOLIC PANEL
Anion gap: 2 — ABNORMAL LOW (ref 5–15)
BUN: 20 mg/dL (ref 6–20)
CHLORIDE: 106 mmol/L (ref 101–111)
CO2: 18 mmol/L — AB (ref 22–32)
CREATININE: 0.85 mg/dL (ref 0.61–1.24)
Calcium: 7.6 mg/dL — ABNORMAL LOW (ref 8.9–10.3)
GFR calc Af Amer: >60 mL/min (ref >60)
Glucose, Bld: 135 mg/dL — ABNORMAL HIGH (ref 65–99)
Potassium: 3.3 mmol/L — ABNORMAL LOW (ref 3.5–5.1)
Sodium: 126 mmol/L — ABNORMAL LOW (ref 135–145)

## 2015-02-18 LAB — LACTIC ACID, PLASMA: Lactic Acid, Venous: 1.5 mmol/L (ref 0.5–2.0)

## 2015-02-18 LAB — MAGNESIUM: MAGNESIUM: 2 mg/dL (ref 1.7–2.4)

## 2015-02-18 MED ORDER — SODIUM CHLORIDE 0.9 % IV SOLN
INTRAVENOUS | Status: AC
  Administered 2015-02-18 – 2015-02-19 (×2): via INTRAVENOUS

## 2015-02-18 MED ORDER — POTASSIUM CHLORIDE CRYS ER 20 MEQ PO TBCR
40.0000 meq | EXTENDED_RELEASE_TABLET | Freq: Once | ORAL | Status: AC
  Administered 2015-02-18: 40 meq via ORAL
  Filled 2015-02-18: qty 2

## 2015-02-18 MED ORDER — ENOXAPARIN SODIUM 40 MG/0.4ML ~~LOC~~ SOLN
40.0000 mg | SUBCUTANEOUS | Status: DC
  Administered 2015-02-18: 40 mg via SUBCUTANEOUS
  Filled 2015-02-18: qty 0.4

## 2015-02-18 MED ORDER — CEPHALEXIN 500 MG PO CAPS
500.0000 mg | ORAL_CAPSULE | Freq: Four times a day (QID) | ORAL | Status: DC
  Administered 2015-02-18 – 2015-02-19 (×4): 500 mg via ORAL
  Filled 2015-02-18 (×4): qty 1

## 2015-02-18 MED ORDER — METRONIDAZOLE 500 MG PO TABS
500.0000 mg | ORAL_TABLET | Freq: Three times a day (TID) | ORAL | Status: DC
  Administered 2015-02-18 – 2015-02-19 (×3): 500 mg via ORAL
  Filled 2015-02-18 (×3): qty 1

## 2015-02-18 NOTE — Consult Note (Signed)
Pt's daughter, Camilo Mander, called to say that family have discussed options presented today and they would like hospice involved when pt returns home. Hospice screen placed. They have decided to keep pt a full code for now.

## 2015-02-18 NOTE — Care Management (Signed)
Patient present from home with weakness and dizziness.  He has a new diagnosis of liver cancer with probable mets.  Patient's daughters and his ex wife will provide 24 hour round the clock care.  Palliative Care consult performed and family is considering hospice services at home.  They will discuss disposition and code status with family members.

## 2015-02-18 NOTE — Progress Notes (Signed)
[facility] NAME: David Stokes    MR#:  [id number]  DATE OF BIRTH:  [birth year]  SUBJECTIVE:  CHIEF COMPLAINT:  No complaint except abdominal distension and the diarrhea with a watery stool twice Chief Complaint  Patient presents with  . Fall   No Diarrhea today. No abdominal pain , no Abdominal distention after paracentesis today, no nausea or vomiting. REVIEW OF SYSTEMS:  CONSTITUTIONAL: No fever, positive generalized weakness.  EYES: No blurred or double vision.  EARS, NOSE, AND THROAT: No tinnitus or ear pain.  RESPIRATORY: No cough, shortness of breath, wheezing or hemoptysis.  CARDIOVASCULAR: No chest pain, orthopnea, edema.  GASTROINTESTINAL: Abdominal distention is better and no diarrhea. No nausea, vomiting or abdominal pain.  GENITOURINARY: No dysuria, hematuria.  ENDOCRINE: No polyuria, nocturia,  HEMATOLOGY: No anemia, easy bruising or bleeding SKIN: No rash or lesion. MUSCULOSKELETAL: No joint pain or arthritis.   NEUROLOGIC: No tingling, numbness, weakness.  PSYCHIATRY: No anxiety or depression.   DRUG ALLERGIES:  No Known Allergies  VITALS:  Blood pressure 125/71, pulse 58, temperature 97.4 F (36.3 C), temperature source Oral, resp. rate 19, height 6' (1.829 m), weight 76.613 kg (168 lb 14.4 oz), SpO2 100 %.  PHYSICAL EXAMINATION:  GENERAL:  64 y.o.-year-old patient lying in the bed with no acute distress.  EYES: Pupils equal, round, reactive to light and accommodation. No scleral icterus. Extraocular muscles intact.  HEENT: Head atraumatic, normocephalic. Oropharynx and nasopharynx clear.  NECK:  Supple, no jugular venous distention. No thyroid enlargement, no tenderness.  LUNGS: Normal breath sounds bilaterally, no wheezing, rales,rhonchi or crepitation. No use of accessory muscles of respiration.  CARDIOVASCULAR: S1, S2 normal. No murmurs, rubs, or gallops.  ABDOMEN: Soft, nontender, no  distention.  Bowel sounds present. No ascites signs. No organomegaly or mass.  EXTREMITIES: No pedal edema, cyanosis, or clubbing.  NEUROLOGIC: Cranial nerves II through XII are intact. Muscle strength 5/5 in all extremities. Sensation intact. Gait not checked.  PSYCHIATRIC: The patient is alert and oriented x 3.  SKIN: No obvious rash, lesion, or ulcer. But has a jaundice.   LABORATORY PANEL:   CBC  Recent Labs Lab 02/18/15 0534  WBC 13.2*  HGB 8.1*  HCT 24.2*  PLT 176   ------------------------------------------------------------------------------------------------------------------  Chemistries   Recent Labs Lab 02/16/15 0001  02/18/15 0534  NA  --   < > 126*  K  --   < > 3.3*  CL  --   < > 106  CO2  --   < > 18*  GLUCOSE  --   < > 135*  BUN  --   < > 20  CREATININE 0.80  < > 0.85  CALCIUM  --   < > 7.6*  MG  --   < > 2.0  AST 128*  --   --   ALT 24  --   --   ALKPHOS 199*  --   --   BILITOT 8.0*  --   --   < > = values in this interval not displayed. ------------------------------------------------------------------------------------------------------------------  Cardiac Enzymes  Recent Labs Lab 02/16/15 0405  TROPONINI 0.09*   ------------------------------------------------------------------------------------------------------------------  RADIOLOGY:  US Paracentesis  02/18/2015   CLINICAL DATA:  Hepatocellular carcinoma and ascites.  EXAM: ULTRASOUND GUIDED  PARACENTESIS  COMPARISON:  None.  PROCEDURE: An ultrasound guided paracentesis was thoroughly discussed with the patient and questions answered. The benefits, risks, alternatives and complications were also discussed. The patient  understands and wishes to proceed with the procedure. Written consent was obtained.  Ultrasound was performed to localize and mark an adequate pocket of fluid in the left lower quadrant of the abdomen. The area was then prepped and draped in the normal sterile fashion. 1%  Lidocaine was used for local anesthesia. Under ultrasound guidance a 6 French Safe-T-Centesis catheter was introduced. Paracentesis was performed. The catheter was removed and a dressing applied.  COMPLICATIONS: None.  FINDINGS: A total of approximately 2.5 L of yellow fluid was removed.  IMPRESSION: Successful ultrasound guided paracentesis yielding 2.5 L of ascites.   Electronically Signed   By: Aletta Edouard M.D.   On: 02/18/2015 13:54    EKG:   Orders placed or performed during the hospital encounter of 02/15/15  . EKG 12-Lead  . 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 Patient is asymptomatic except burning urination.   Sepsis with bacteremia:   Blood culture shows Klebsiella. Leukocytosis improved. Discontinued antibiotics broad-spectrum with IV vancomycin and Zosyn, and started Cipro. Follow-up ID consult.  Ascites, may have intra-abdominal source with possible SBP. S/p US guided paracentesis today. resume lovenox.  C. difficile colitis: Stool test is positive for C. Difficile, continue Flagyl, and diarrhea resolved. Hyponatremia secondary to poor by mouth intake. Continue NS iv, follow-up BMP  Fall and dizziness: Better , Per physical therapy evaluation, need home health and physical therapy. hepatocellular Cancer with portal vein thrombosis:  not a candidate for any treatment as per [facility] oncology. Consult palliative care. Patient has terminal cancer as per [facility] note. Portal hypertension: continue his empiric beta blockers.  Hypokalemia. Glaucoma supplement and a follow-up BMP. Hypomagnesemia: Magnesium supplement. Improved. Anemia of chronic disease: Hemoglobin is stable. Lactic acidosis: Treatment as above. Improved. Moderate malnutrition: Follow-up dietitian  recommendation.     All the records are reviewed and case discussed with Care Management/Social Workerr. Management plans discussed with the patient, the patient's daughter and they are in agreement.  CODE STATUS: Full code  TOTAL TIME TAKING CARE OF THIS PATIENT: 46 minutes.   POSSIBLE D/C IN 3 DAYS, DEPENDING ON CLINICAL CONDITION.   Demetrios Loll M.D on 02/18/2015 at 3:27 PM  Between 7am to 6pm - Pager - [phone]  After 6pm go to [email or web address] - password [email or web address] [facility]  Office  [phone]  CC: Primary care physician; [name], MD

## 2015-02-18 NOTE — Care Management (Signed)
Will present hospice agencies to patient's daughter Brayton Layman so family can chose agency to follow patient when he discharges home

## 2015-02-18 NOTE — Progress Notes (Signed)
PT Cancellation Note  Patient Details Name: David Stokes MRN: [id number] DOB: 1951/08/02   Cancelled Treatment:    Reason Eval/Treat Not Completed: Patient declined, no reason specified (patient declined all activities with PT, stating "not right now; maybe later".  Offered OOB to chair, bed-level activities; patient consistently declines.  Will re-attempt at later time/date as patient appropriate and agreeable)  Elisia Stepp H. Owens Shark, PT, DPT 02/18/2015, 3:40 PM [phone]

## 2015-02-18 NOTE — Progress Notes (Signed)
Notified dr. Bridgett Larsson of Na 126 and K 3.3 this morning. No new orders.

## 2015-02-18 NOTE — Consult Note (Signed)
Palliative Medicine Inpatient Consult Note   Name: David Stokes Date: 02/18/2015 MRN: [id number]  DOB: [birth year]  Referring Physician: Demetrios Loll, MD  Palliative Care consult requested for this 64 y.o. male for goals of medical therapy in patient with hepatocellular carcinoma with portal vein thrombosis and LAN admitted with sepsis, C.diff.   David Stokes is a 64 yo man recently diagnosed with hepatocellular carcinoma (02/08/15 at [facility]), no tx offered. He was admitted 02/15/15 after a fall. Work-up significant for leukocytosis and lactic acidosis. Stool + for C.diff. At present, David Stokes is sitting up in bed, breakfast tray in front of him. Answers questions haltingly. Family not present.   REVIEW OF SYSTEMS:  Pain: None Dyspnea:  No Nausea/Vomiting:  No Diarrhea:  Yes Constipation:   No Depression:   No Anxiety:   No Fatigue:   Yes  SOCIAL HISTORY: David Stokes is divorced. He lives at home with 2 of his 3 daughters. He has a son who is incarcerated. He worked as a Horticulturist, commercial.  reports that he has quit smoking. He does not have any smokeless tobacco history on file.  LEGAL DOCUMENTS:  CODE STATUS: Full code  PAST MEDICAL HISTORY: Past Medical History  Diagnosis Date  . Liver cancer     PAST SURGICAL HISTORY: History reviewed. No pertinent past surgical history.  ALLERGIES:  has No Known Allergies.  MEDICATIONS:  Current Facility-Administered Medications  Medication Dose Route Frequency Provider Last Rate Last Dose  . acetaminophen (TYLENOL) tablet 650 mg  650 mg Oral Q6H PRN Epifanio Lesches, MD       Or  . acetaminophen (TYLENOL) suppository 650 mg  650 mg Rectal Q6H PRN Epifanio Lesches, MD      . ciprofloxacin (CIPRO) IVPB 400 mg  400 mg Intravenous Q12H Demetrios Loll, MD   400 mg at 02/18/15 0000  . feeding supplement (ENSURE ENLIVE) (ENSURE ENLIVE) liquid 237 mL  237 mL Oral BID WC Demetrios Loll, MD   237 mL at 02/18/15 0901  . metroNIDAZOLE (FLAGYL) IVPB 500 mg  500 mg  Intravenous Q6H Demetrios Loll, MD   500 mg at 02/18/15 0902  . nadolol (CORGARD) tablet 20 mg  20 mg Oral Daily Epifanio Lesches, MD   20 mg at 02/18/15 0901  . ondansetron (ZOFRAN) tablet 4 mg  4 mg Oral Q6H PRN Epifanio Lesches, MD       Or  . ondansetron (ZOFRAN) injection 4 mg  4 mg Intravenous Q6H PRN Epifanio Lesches, MD      . senna-docusate (Senokot-S) tablet 1 tablet  1 tablet Oral QHS PRN Epifanio Lesches, MD        Vital Signs: BP 139/71 mmHg  Pulse 70  Temp(Src) 98.2 F (36.8 C) (Oral)  Resp 19  Ht 6' (1.829 m)  Wt 76.613 kg (168 lb 14.4 oz)  BMI 22.90 kg/m2  SpO2 99% Filed Weights   02/16/15 0449 02/17/15 0651 02/18/15 0454  Weight: 82 kg (180 lb 12.4 oz) 77.384 kg (170 lb 9.6 oz) 76.613 kg (168 lb 14.4 oz)    Estimated body mass index is 22.9 kg/(m^2) as calculated from the following:   Height as of this encounter: 6' (1.829 m).   Weight as of this encounter: 76.613 kg (168 lb 14.4 oz).   PHYSICAL EXAM:  General: ill-appearing HEENT: OP clear, poor dentition, hearing intact Neck: Trachea midline  Cardiovascular: regular rate and rhythm Pulmonary/Chest: Clear ant fields Abdominal: distended, hypoactive bowel sounds GU: No SP tenderness Extremities: trace edema  Neurological: moves all extremities, follows simple commands Skin: no rashes Psychiatric: alert, oriented to person, place   LABS: CBC:  Recent Labs Lab 02/17/15 0503 02/18/15 0534  WBC 11.9* 13.2*  HGB 8.0* 8.1*  HCT 23.8* 24.2*  PLT 166 176   Comprehensive Metabolic Panel:  Recent Labs Lab 02/15/15 1005 02/16/15 0001 02/16/15 0405 02/18/15 0534  NA 127*  --  128* 126*  K 4.7  --  4.5 3.3*  CL 98*  --  105 106  CO2 17*  --  19* 18*  GLUCOSE 120*  --  107* 135*  BUN 14  --  14 20  CREATININE 0.88 0.80 0.71 0.85  CALCIUM 8.5*  --  7.6* 7.6*  MG  --   --  1.7 2.0  AST 164* 128*  --   --   ALT 29 24  --   --   ALKPHOS 260* 199*  --   --   BILITOT 9.8* 8.0*  --   --      IMPRESSION:  David Stokes is a 64 yo man recently diagnosed with hepatocellular carcinoma (02/08/15 at [facility]), no tx offered. He was admitted 02/15/15 after a fall. Work-up significant for leukocytosis and lactic acidosis. Stool + for C.diff.   I spoke with David Stokes. He is able to tell me that he was recently at [facility] and that they "found something inside me". When asked if he has cancer, he says that he does. He is unsure if hospice is involved in his care and asks that I spead with his daughter.   I attempted to contact both daughters but unable to reach them by phone. Will continue to try. RN to page me if they come to hospital.  PLAN: Family meeting when daughters available  More than 50% of the visit was spent in counseling/coordination of care: YES  Time spent: 70 minutes

## 2015-02-18 NOTE — Consult Note (Signed)
Reason for Consult: GNR bacteremia, UTI, sepsis    Referring Physician: Daria Pastures  Active Problems:   Sepsis   Hepatic encephalopathy   Hepatocellular carcinoma   Malnutrition of moderate degree   HPI: David Stokes is a 64 y.o. male with a known history of recently diagnosedhepatocellular CA , portal vein thrombosis brought in because of the fall on 5/20 and found to have wbc 19, UTI and bacteremia with Klebsiella.  C diff is also +. Has been on broad spectrum abx.  Clinically improving. Is having some loose stools per pt.  No fevers chills since admit. Does have foley cath in place   Past Medical History  Diagnosis Date  . Liver cancer    History reviewed. No pertinent past surgical history.  Allergies: No Known Allergies  Current antibiotics: Antibiotics Given (last 72 hours)    Date/Time Action Medication Dose Rate   02/15/15 2152 Given   piperacillin-tazobactam (ZOSYN) IVPB 3.375 g 3.375 g 12.5 mL/hr   02/15/15 2153 Given   vancomycin (VANCOCIN) IVPB 1000 mg/200 mL premix 1,000 mg 200 mL/hr   02/16/15 0531 Given   vancomycin (VANCOCIN) IVPB 1000 mg/200 mL premix 1,000 mg 200 mL/hr   02/16/15 0532 Given   piperacillin-tazobactam (ZOSYN) IVPB 3.375 g 3.375 g 12.5 mL/hr   02/16/15 1048 Given   metroNIDAZOLE (FLAGYL) IVPB 500 mg 500 mg 100 mL/hr   02/16/15 1300 Given   piperacillin-tazobactam (ZOSYN) IVPB 3.375 g 3.375 g 12.5 mL/hr   02/16/15 1656 Given   metroNIDAZOLE (FLAGYL) IVPB 500 mg 500 mg 100 mL/hr   02/16/15 1659 Given   vancomycin (VANCOCIN) IVPB 1000 mg/200 mL premix 1,000 mg 200 mL/hr   02/16/15 2157 Given   piperacillin-tazobactam (ZOSYN) IVPB 3.375 g 3.375 g 12.5 mL/hr   02/16/15 2157 Given   metroNIDAZOLE (FLAGYL) IVPB 500 mg 500 mg 100 mL/hr   02/17/15 0329 Given   metroNIDAZOLE (FLAGYL) IVPB 500 mg 500 mg 100 mL/hr   02/17/15 0521 Given   piperacillin-tazobactam (ZOSYN) IVPB 3.375 g 3.375 g 12.5 mL/hr   02/17/15 0521 Given   vancomycin  (VANCOCIN) IVPB 1000 mg/200 mL premix 1,000 mg 200 mL/hr   02/17/15 0819 Given   metroNIDAZOLE (FLAGYL) IVPB 500 mg 500 mg 100 mL/hr   02/17/15 1213 Given   ciprofloxacin (CIPRO) IVPB 400 mg 400 mg 200 mL/hr   02/17/15 1531 Given   metroNIDAZOLE (FLAGYL) IVPB 500 mg 500 mg 100 mL/hr   02/17/15 2102 Given   metroNIDAZOLE (FLAGYL) IVPB 500 mg 500 mg 100 mL/hr   02/18/15 0000 Given   ciprofloxacin (CIPRO) IVPB 400 mg 400 mg 200 mL/hr   02/18/15 0309 Given   metroNIDAZOLE (FLAGYL) IVPB 500 mg 500 mg 100 mL/hr   02/18/15 0902 Given   metroNIDAZOLE (FLAGYL) IVPB 500 mg 500 mg 100 mL/hr   02/18/15 1253 Given   ciprofloxacin (CIPRO) IVPB 400 mg 400 mg 200 mL/hr   02/18/15 1524 Given   metroNIDAZOLE (FLAGYL) IVPB 500 mg 500 mg 100 mL/hr     MEDICATIONS: . ciprofloxacin  400 mg Intravenous Q12H  . enoxaparin (LOVENOX) injection  40 mg Subcutaneous Q24H  . feeding supplement (ENSURE ENLIVE)  237 mL Oral BID WC  . metronidazole  500 mg Intravenous Q6H  . nadolol  20 mg Oral Daily   History  Substance Use Topics  . Smoking status: Former Research scientist (life sciences)  . Smokeless tobacco: Not on file  . Alcohol Use: Not on file   History reviewed. No pertinent family history.  Review  of Systems - 11 systems reviewed and negative per HPI OBJECTIVE: Temp:  [97.4 F (36.3 C)-99.9 F (37.7 C)] 97.4 F (36.3 C) (05/23 1327) Pulse Rate:  [58-73] 58 (05/23 1327) Resp:  [16-20] 19 (05/23 1327) BP: (117-141)/(67-73) 125/71 mmHg (05/23 1327) SpO2:  [98 %-100 %] 100 % (05/23 1327) Weight:  [76.613 kg (168 lb 14.4 oz)] 76.613 kg (168 lb 14.4 oz) (05/23 0454) lying in the bed with no acute distress. Thin frai, EYES: Pupils equal, round, reactive to light and accommodation. No scleral icterus. Extraocular muscles intact.  HEENT: Head atraumatic, normocephalic. Oropharynx and nasopharynx clear.  NECK: Supple, no jugular venous distention. No thyroid enlargement, no tenderness.  LUNGS: Normal breath sounds  bilaterally, no wheezing, rales,rhonchi or crepitation. No use of accessory muscles of respiration.  CARDIOVASCULAR: S1, S2 normal. No murmurs, rubs, or gallops.  ABDOMEN: Soft, nontender, nondistended. Bowel sounds present. No organomegaly or mass Foley in place.  EXTREMITIES: No pedal edema, cyanosis, or clubbing.  NEUROLOGIC: Cranial nerves II through XII are intact. Muscle strength 5/5 in all extremities. Sensation intact. Gait not checked.  PSYCHIATRIC: The patient is alert and oriented x 3.  SKIN: No obvious rash, lesion, or ulcer.   LABS: Results for orders placed or performed during the hospital encounter of 02/15/15 (from the past 48 hour(s))  Vancomycin, trough     Status: Abnormal   Collection Time: 02/16/15  9:19 PM  Result Value Ref Range   Vancomycin Tr 23 (HH) 10 - 20 ug/mL    Comment: CRITICAL RESULT CALLED TO, READ BACK BY AND VERIFIED WITH RESULTS VERIFIED BY REPEAT TESTING C/MATT MCBANE AT 2324 02/16/15.PMH   CBC     Status: Abnormal   Collection Time: 02/17/15  5:03 AM  Result Value Ref Range   WBC 11.9 (H) 3.8 - 10.6 K/uL   RBC 2.56 (L) 4.40 - 5.90 MIL/uL   Hemoglobin 8.0 (L) 13.0 - 18.0 g/dL   HCT 23.8 (L) 40.0 - 52.0 %   MCV 93.0 80.0 - 100.0 fL   MCH 31.1 26.0 - 34.0 pg   MCHC 33.4 32.0 - 36.0 g/dL   RDW 17.8 (H) 11.5 - 14.5 %   Platelets 166 150 - 440 K/uL  Basic metabolic panel     Status: Abnormal   Collection Time: 02/18/15  5:34 AM  Result Value Ref Range   Sodium 126 (L) 135 - 145 mmol/L   Potassium 3.3 (L) 3.5 - 5.1 mmol/L   Chloride 106 101 - 111 mmol/L   CO2 18 (L) 22 - 32 mmol/L   Glucose, Bld 135 (H) 65 - 99 mg/dL   BUN 20 6 - 20 mg/dL   Creatinine, Ser 0.85 0.61 - 1.24 mg/dL   Calcium 7.6 (L) 8.9 - 10.3 mg/dL   GFR calc non Af Amer >60 >60 mL/min   GFR calc Af Amer >60 >60 mL/min    Comment: (NOTE) The eGFR has been calculated using the CKD EPI equation. This calculation has not been validated in all clinical situations. eGFR's  persistently <60 mL/min signify possible Chronic Kidney Disease.    Anion gap 2 (L) 5 - 15  CBC     Status: Abnormal   Collection Time: 02/18/15  5:34 AM  Result Value Ref Range   WBC 13.2 (H) 3.8 - 10.6 K/uL   RBC 2.60 (L) 4.40 - 5.90 MIL/uL   Hemoglobin 8.1 (L) 13.0 - 18.0 g/dL   HCT 24.2 (L) 40.0 - 52.0 %   MCV 93.2  80.0 - 100.0 fL   MCH 31.3 26.0 - 34.0 pg   MCHC 33.6 32.0 - 36.0 g/dL   RDW 17.8 (H) 11.5 - 14.5 %   Platelets 176 150 - 440 K/uL  Magnesium     Status: None   Collection Time: 02/18/15  5:34 AM  Result Value Ref Range   Magnesium 2.0 1.7 - 2.4 mg/dL  Lactic acid, plasma     Status: None   Collection Time: 02/18/15  5:34 AM  Result Value Ref Range   Lactic Acid, Venous 1.5 0.5 - 2.0 mmol/L   No components found for: ESR, C REACTIVE PROTEIN MICRO:  IMAGING: US Paracentesis  02/18/2015   CLINICAL DATA:  Hepatocellular carcinoma and ascites.  EXAM: ULTRASOUND GUIDED  PARACENTESIS  COMPARISON:  None.  PROCEDURE: An ultrasound guided paracentesis was thoroughly discussed with the patient and questions answered. The benefits, risks, alternatives and complications were also discussed. The patient understands and wishes to proceed with the procedure. Written consent was obtained.  Ultrasound was performed to localize and mark an adequate pocket of fluid in the left lower quadrant of the abdomen. The area was then prepped and draped in the normal sterile fashion. 1% Lidocaine was used for local anesthesia. Under ultrasound guidance a 6 French Safe-T-Centesis catheter was introduced. Paracentesis was performed. The catheter was removed and a dressing applied.  COMPLICATIONS: None.  FINDINGS: A total of approximately 2.5 L of yellow fluid was removed.  IMPRESSION: Successful ultrasound guided paracentesis yielding 2.5 L of ascites.   Electronically Signed   By: Aletta Edouard M.D.   On: 02/18/2015 13:54    HISTORICAL MICRO/IMAGING  Assessment/Plan:    64 y.o. male with a  known history of recently diagnosedhepatocellular CA , portal vein thrombosis brought in because of the fall on 5/20 and found to have wbc 19, UTI and bacteremia with Klebsiella.  C diff is also +. Has been on broad spectrum abx.  Clinically improving. Is having some loose stools per pt.  No fevers chills since admit. Does have foley cath in place  Rec Narrow abx to keflex 500 qid for 7 more days Flagyl 500 tid for 17 days Fu as needed

## 2015-02-18 NOTE — Consult Note (Signed)
I met with pt's daughter, Lupita Dawn, and daughter, Brayton Layman, was on speaker phone. Updated them on pt's current condition. Daughters confirm that they are living with pt. Both daughters work but pt's ex-wife is also assisting in his care and pt has someone with him 24/7.   We discussed the involvement of hospice in pt's care. Daughters say that they were told at [facility] that pt "was not ready for hospice". I explained that his likely metastatic cancer does qualify him for hospice. We also discussed pt's code status. Daughters say they would like to discuss both hospice and pt's code status further with each other and with their mother. Pt remains full code for now.  Daughters expressed appreciation for meeting. All questions answered.

## 2015-02-19 LAB — CBC
HCT: 25.6 % — ABNORMAL LOW (ref 40.0–52.0)
HEMOGLOBIN: 8.6 g/dL — AB (ref 13.0–18.0)
MCH: 31.4 pg (ref 26.0–34.0)
MCHC: 33.6 g/dL (ref 32.0–36.0)
MCV: 93.4 fL (ref 80.0–100.0)
PLATELETS: 197 10*3/uL (ref 150–440)
RBC: 2.74 MIL/uL — ABNORMAL LOW (ref 4.40–5.90)
RDW: 17.8 % — AB (ref 11.5–14.5)
WBC: 12.1 10*3/uL — ABNORMAL HIGH (ref 3.8–10.6)

## 2015-02-19 LAB — BASIC METABOLIC PANEL
ANION GAP: 5 (ref 5–15)
BUN: 20 mg/dL (ref 6–20)
CHLORIDE: 106 mmol/L (ref 101–111)
CO2: 16 mmol/L — ABNORMAL LOW (ref 22–32)
Calcium: 7.7 mg/dL — ABNORMAL LOW (ref 8.9–10.3)
Creatinine, Ser: 0.85 mg/dL (ref 0.61–1.24)
GFR calc Af Amer: >60 mL/min (ref >60)
Glucose, Bld: 106 mg/dL — ABNORMAL HIGH (ref 65–99)
POTASSIUM: 4.1 mmol/L (ref 3.5–5.1)
SODIUM: 127 mmol/L — AB (ref 135–145)

## 2015-02-19 MED ORDER — NADOLOL 20 MG PO TABS: 20.0000 mg | ORAL_TABLET | Freq: Every day | ORAL | Status: AC

## 2015-02-19 MED ORDER — METRONIDAZOLE 500 MG PO TABS: 500.0000 mg | ORAL_TABLET | Freq: Three times a day (TID) | ORAL | Status: AC

## 2015-02-19 MED ORDER — NADOLOL 20 MG PO TABS: 20.0000 mg | ORAL_TABLET | Freq: Every day | ORAL | Status: DC

## 2015-02-19 MED ORDER — METRONIDAZOLE 500 MG PO TABS: 500.0000 mg | ORAL_TABLET | Freq: Three times a day (TID) | ORAL | Status: DC

## 2015-02-19 MED ORDER — CEPHALEXIN 500 MG PO CAPS: 500.0000 mg | ORAL_CAPSULE | Freq: Three times a day (TID) | ORAL | Status: AC

## 2015-02-19 NOTE — Discharge Summary (Signed)
[facility] NAME: David Stokes    MR#:  [id number]  DATE OF BIRTH:  Jan 20, 1951  DATE OF ADMISSION:  02/15/2015 ADMITTING PHYSICIAN: Epifanio Lesches, MD  DATE OF DISCHARGE: 02/19/2015  3:15 PM  PRIMARY CARE PHYSICIAN: [name], MD    ADMISSION DIAGNOSIS:  Sepsis, due to unspecified organism [A41.9]   DISCHARGE DIAGNOSIS:   Sepsis with bacteremia and UTI. C. difficile colitis. Ascites. Hyponatremia. Lactic acidosis. hepatocellular Cancer with portal vein thrombosis.   SECONDARY DIAGNOSIS:   Past Medical History  Diagnosis Date  . Liver cancer     HOSPITAL COURSE:  David Stokes is a 64 y.o. male with a known history of recently diagnosed Hepatocellular CA ,, portal vein thrombosis brought in because of the fall. Patient fell this morning and he fell on his hips. Patient complained of left leg pain initially but denied any pain at this time. He denied abdominal pain. No nausea or vomiting. Complained of trouble urinating and burning urination. Patient found to have a white count up to 19, has sodium 127. Patient's lactic acid is 5. Workup with a chest x-ray has been negative.  Sepsis with bacteremia and UTI:  The patient was treated with broad-spectrum antibiotics with IV vancomycin and Zosyn, which was discontinued since Blood culture showed Klebsiella. Urine culture also showed Klebsiella. The anti-biotics was changed to Keflex for 7 more days per Dr. Blane Ohara recommendation. Leukocytosis improved.  Ascites, the patient got US guided paracentesis with 2.5 L fluid removal yesterday.   C. difficile colitis: The patient has had a diarrhea for several days. Stool test is positive for C. Difficile, so he has been treated with Flagyl, and diarrhea resolved. He needs 10 more days by mouth Flagyl after discharge.  Hyponatremia secondary to poor by mouth intake. He is treated with NS iv, but the  sodium level is still low at 127.  Fall and dizziness: Better , Per physical therapy evaluation, need home health and physical therapy. hepatocellular Cancer with portal vein thrombosis:  not a candidate for any treatment as per [facility] oncology. According to palliative care recommendation, the patient's CODE STATUS was changed to DO NOT RESUSCITATE and the patient needs hospice care at home.  Portal hypertension: The patient has been treated with nadolol. Hypokalemia. Improved after potassium supplement treatment. Hypomagnesemia: Magnesium supplement. Improved. Anemia of chronic disease: Hemoglobin is stable. Lactic acidosis: Treatment as above. Improved. Moderate malnutrition: Follow-up dietitian recommendation.  DISCHARGE CONDITIONS:   Stable  CONSULTS OBTAINED:  Treatment Team:  [place] Jack Phifer, MD Adrian Prows, MD  DRUG ALLERGIES:  No Known Allergies  DISCHARGE MEDICATIONS:   Discharge Medication List as of 02/19/2015  1:28 PM    START taking these medications   Details  cephALEXin (KEFLEX) 500 MG capsule Take 1 capsule (500 mg total) by mouth 3 (three) times daily., Starting 02/19/2015, Until Discontinued, Print      CONTINUE these medications which have CHANGED   Details  metroNIDAZOLE (FLAGYL) 500 MG tablet Take 1 tablet (500 mg total) by mouth 3 (three) times daily., Starting 02/19/2015, Until Discontinued, Print    nadolol (CORGARD) 20 MG tablet Take 1 tablet (20 mg total) by mouth daily., Starting 02/19/2015, Until Discontinued, Print      CONTINUE these medications which have NOT CHANGED   Details  traMADol (ULTRAM) 50 MG tablet Take 25-50 mg by mouth every 4 (four) hours as needed for moderate pain., Until Discontinued, Historical Med  DISCHARGE INSTRUCTIONS:   Regular diet, activity as tolerated. Hospice care at home.  If you experience worsening of your admission symptoms, develop shortness of breath, life threatening emergency, suicidal or  homicidal thoughts you must seek medical attention immediately by calling 911 or calling your MD immediately  if symptoms less severe.  You Must read complete instructions/literature along with all the possible adverse reactions/side effects for all the Medicines you take and that have been prescribed to you. Take any new Medicines after you have completely understood and accept all the possible adverse reactions/side effects.   Please note  You were cared for by a hospitalist during your hospital stay. If you have any questions about your discharge medications or the care you received while you were in the hospital after you are discharged, you can call the unit and asked to speak with the hospitalist on call if the hospitalist that took care of you is not available. Once you are discharged, your primary care physician will handle any further medical issues. Please note that NO REFILLS for any discharge medications will be authorized once you are discharged, as it is imperative that you return to your primary care physician (or establish a relationship with a primary care physician if you do not have one) for your aftercare needs so that they can reassess your need for medications and monitor your lab values.    Today   SUBJECTIVE   No complaints.   VITAL SIGNS:  Blood pressure 126/67, pulse 65, temperature 97.7 F (36.5 C), temperature source Oral, resp. rate 19, height 6' (1.829 m), weight 76.613 kg (168 lb 14.4 oz), SpO2 96 %.  I/O:   Intake/Output Summary (Last 24 hours) at 02/19/15 1555 Last data filed at 02/19/15 1312  Gross per 24 hour  Intake 496.25 ml  Output      0 ml  Net 496.25 ml    PHYSICAL EXAMINATION:  GENERAL:  64 y.o.-year-old patient lying in the bed with no acute distress.  EYES: Pupils equal, round, reactive to light and accommodation. Positive scleral icterus. Extraocular muscles intact.  HEENT: Head atraumatic, normocephalic. Oropharynx and nasopharynx clear.   NECK:  Supple, no jugular venous distention. No thyroid enlargement, no tenderness.  LUNGS: Normal breath sounds bilaterally, no wheezing, rales,rhonchi or crepitation. No use of accessory muscles of respiration.  CARDIOVASCULAR: S1, S2 normal. No murmurs, rubs, or gallops.  ABDOMEN: Soft, non-tender, non-distended. Bowel sounds present. No organomegaly or mass.  EXTREMITIES: No pedal edema, cyanosis, or clubbing.  NEUROLOGIC: Cranial nerves II through XII are intact. Muscle strength 5/5 in all extremities. Sensation intact. Gait not checked.  PSYCHIATRIC: The patient is alert and oriented x 3.  SKIN: No obvious rash, lesion, or ulcer. Positive for jaundice.  DATA REVIEW:   CBC  Recent Labs Lab 02/19/15 0532  WBC 12.1*  HGB 8.6*  HCT 25.6*  PLT 197    Chemistries   Recent Labs Lab 02/16/15 0001  02/18/15 0534 02/19/15 0532  NA  --   < > 126* 127*  K  --   < > 3.3* 4.1  CL  --   < > 106 106  CO2  --   < > 18* 16*  GLUCOSE  --   < > 135* 106*  BUN  --   < > 20 20  CREATININE 0.80  < > 0.85 0.85  CALCIUM  --   < > 7.6* 7.7*  MG  --   < > 2.0  --  AST 128*  --   --   --   ALT 24  --   --   --   ALKPHOS 199*  --   --   --   BILITOT 8.0*  --   --   --   < > = values in this interval not displayed.  Cardiac Enzymes  Recent Labs Lab 02/16/15 0405  TROPONINI 0.09*    Microbiology Results  Results for orders placed or performed during the hospital encounter of 02/15/15  Culture, blood (routine x 2)     Status: None   Collection Time: 02/15/15 10:49 AM  Result Value Ref Range Status   Specimen Description BLOOD BLD  Final   Special Requests NONE  Final   Culture  Setup Time   Final    GRAM NEGATIVE RODS IN BOTH AEROBIC AND ANAEROBIC BOTTLES CRITICAL RESULT CALLED TO, READ BACK BY AND VERIFIED WITH: C/CHARLOTTEKYEI AT 2312 02/15/15.PMH CONFIRMED BY RWW    Culture   Final    KLEBSIELLA PNEUMONIAE IN BOTH AEROBIC AND ANAEROBIC BOTTLES    Report Status  02/17/2015 FINAL  Final   Organism ID, Bacteria KLEBSIELLA PNEUMONIAE  Final      Susceptibility   Klebsiella pneumoniae - MIC*    AMPICILLIN >=32 RESISTANT Resistant     CEFTAZIDIME <=1 SENSITIVE Sensitive     CEFAZOLIN <=4 SENSITIVE Sensitive     CEFTRIAXONE <=1 SENSITIVE Sensitive     CIPROFLOXACIN <=0.25 SENSITIVE Sensitive     GENTAMICIN <=1 SENSITIVE Sensitive     IMIPENEM <=0.25 SENSITIVE Sensitive     TRIMETH/SULFA <=20 SENSITIVE Sensitive     CEFOXITIN <=4 SENSITIVE Sensitive     * KLEBSIELLA PNEUMONIAE  Culture, blood (routine x 2)     Status: None   Collection Time: 02/15/15 10:50 AM  Result Value Ref Range Status   Specimen Description BLOOD  Final   Special Requests NONE  Final   Culture  Setup Time   Final    GRAM NEGATIVE RODS IN BOTH AEROBIC AND ANAEROBIC BOTTLES CRITICAL RESULT CALLED TO, READ BACK BY AND VERIFIED WITH: CHARLOTTE KYEI AT 2312 02/15/15.PMH CONFIRMED BY RWW    Culture   Final    KLEBSIELLA PNEUMONIAE IN BOTH AEROBIC AND ANAEROBIC BOTTLES    Report Status 02/17/2015 FINAL  Final   Organism ID, Bacteria KLEBSIELLA PNEUMONIAE  Final      Susceptibility   Klebsiella pneumoniae - MIC*    AMPICILLIN >=32 RESISTANT Resistant     CEFTAZIDIME <=1 SENSITIVE Sensitive     CEFAZOLIN <=4 SENSITIVE Sensitive     CEFTRIAXONE <=1 SENSITIVE Sensitive     CIPROFLOXACIN <=0.25 SENSITIVE Sensitive     GENTAMICIN <=1 SENSITIVE Sensitive     IMIPENEM <=0.25 SENSITIVE Sensitive     TRIMETH/SULFA <=20 SENSITIVE Sensitive     CEFOXITIN <=4 SENSITIVE Sensitive     * KLEBSIELLA PNEUMONIAE  Urine culture     Status: None   Collection Time: 02/15/15  4:02 PM  Result Value Ref Range Status   Specimen Description URINE, CLEAN CATCH  Final   Special Requests NONE  Final   Culture   Final    50,000 COLONIES/mL KLEBSIELLA PNEUMONIAE 10,000 COLONIES/mL STREPTOCOCCI, ALPHA HEMOLYTIC    Report Status 02/17/2015 FINAL  Final   Organism ID, Bacteria KLEBSIELLA PNEUMONIAE   Final      Susceptibility   Klebsiella pneumoniae - MIC*    AMPICILLIN >=32 RESISTANT Resistant     CEFTAZIDIME <=1 SENSITIVE Sensitive  CEFAZOLIN <=4 SENSITIVE Sensitive     CEFTRIAXONE <=1 SENSITIVE Sensitive     CIPROFLOXACIN <=0.25 SENSITIVE Sensitive     GENTAMICIN <=1 SENSITIVE Sensitive     IMIPENEM <=0.25 SENSITIVE Sensitive     TRIMETH/SULFA <=20 SENSITIVE Sensitive     CEFOXITIN <=4 SENSITIVE Sensitive     * 50,000 COLONIES/mL KLEBSIELLA PNEUMONIAE  C difficile quick scan w PCR reflex [facility])     Status: None   Collection Time: 02/15/15  6:32 PM  Result Value Ref Range Status   C Diff antigen POSITIVE  Final   C Diff toxin NEGATIVE  Final   C Diff interpretation   Corrected    Positive for toxigenic C. difficile, active toxin production not detected. Patient has toxigenic C. difficile organisms present in the bowel, but toxin was not detected. The patient may be a carrier or the level of toxin in the sample was below the limit  of detection. This information should be used in conjunction with the patient's clinical history when deciding on possible therapy.     Comment: CORRECTED ON 05/21 AT 1343: PREVIOUSLY REPORTED AS REFLEX TO PCR  Clostridium Difficile by PCR     Status: Abnormal   Collection Time: 02/15/15  6:32 PM  Result Value Ref Range Status   C difficile by pcr POSITIVE (A) NEGATIVE Final    Comment: CRITICAL RESULT CALLED TO, READ BACK BY AND VERIFIED WITH: C/CHARLOTTE KYEI AT 2034 02/15/15.PMH     RADIOLOGY:  US Paracentesis  02/18/2015   CLINICAL DATA:  Hepatocellular carcinoma and ascites.  EXAM: ULTRASOUND GUIDED  PARACENTESIS  COMPARISON:  None.  PROCEDURE: An ultrasound guided paracentesis was thoroughly discussed with the patient and questions answered. The benefits, risks, alternatives and complications were also discussed. The patient understands and wishes to proceed with the procedure. Written consent was obtained.  Ultrasound was performed to  localize and mark an adequate pocket of fluid in the left lower quadrant of the abdomen. The area was then prepped and draped in the normal sterile fashion. 1% Lidocaine was used for local anesthesia. Under ultrasound guidance a 6 French Safe-T-Centesis catheter was introduced. Paracentesis was performed. The catheter was removed and a dressing applied.  COMPLICATIONS: None.  FINDINGS: A total of approximately 2.5 L of yellow fluid was removed.  IMPRESSION: Successful ultrasound guided paracentesis yielding 2.5 L of ascites.   Electronically Signed   By: Aletta Edouard M.D.   On: 02/18/2015 13:54        Management plans discussed with the patient, nurse and case manager.   CODE STATUS:     Code Status Orders        Start     Ordered   02/15/15 1830  Full code   Continuous     02/15/15 1830    Advance Directive Documentation        Most Recent Value   Type of Advance Directive  Healthcare Power of Attorney   Pre-existing out of facility DNR order (yellow form or pink MOST form)     "MOST" Form in Place?        TOTAL TIME TAKING CARE OF THIS PATIENT: 42 minutes.    Demetrios Loll M.D on 02/19/2015 at 3:55 PM  Between 7am to 6pm - Pager - [phone]  After 6pm go to [email or web address] - password [email or web address] [facility]  Office  [phone]  CC: Primary care physician; [name], MD

## 2015-02-19 NOTE — Care Management (Signed)
Daughter Brayton Layman chose [facility] to provide services at home.  Obtained script for DME- hospital bed, walker, BSC, shower chair.   Called and provided referral information to  Laureen Ochs with [facility]

## 2015-02-19 NOTE — Discharge Instructions (Signed)
Regular diet. Activity as tolerated. Hospice care.

## 2015-02-19 NOTE — Progress Notes (Signed)
Discharge instructions along with home medication list and follow up gone over with patients daughter and family. Verbalized that she understood. Printed rx given with patients discharge instruction. Patient on ra. No distress noted. To be discharged home with hospice. Iv and telemetry removed. [place]

## 2015-03-14 ENCOUNTER — Encounter: Payer: Self-pay | Admitting: Oncology

## 2015-03-14 ENCOUNTER — Inpatient Hospital Stay: Payer: Medicaid Other | Attending: Oncology | Admitting: Oncology

## 2015-03-14 VITALS — BP 115/79 | HR 76 | Temp 96.1°F | Resp 18 | Ht 73.0 in

## 2015-03-14 DIAGNOSIS — M7989 Other specified soft tissue disorders: Secondary | ICD-10-CM | POA: Diagnosis not present

## 2015-03-14 DIAGNOSIS — R109 Unspecified abdominal pain: Secondary | ICD-10-CM | POA: Diagnosis not present

## 2015-03-14 DIAGNOSIS — R634 Abnormal weight loss: Secondary | ICD-10-CM

## 2015-03-14 DIAGNOSIS — E871 Hypo-osmolality and hyponatremia: Secondary | ICD-10-CM | POA: Diagnosis not present

## 2015-03-14 DIAGNOSIS — R5383 Other fatigue: Secondary | ICD-10-CM | POA: Diagnosis not present

## 2015-03-14 DIAGNOSIS — Z808 Family history of malignant neoplasm of other organs or systems: Secondary | ICD-10-CM

## 2015-03-14 DIAGNOSIS — Z800 Family history of malignant neoplasm of digestive organs: Secondary | ICD-10-CM

## 2015-03-14 DIAGNOSIS — R630 Anorexia: Secondary | ICD-10-CM

## 2015-03-14 DIAGNOSIS — Z79899 Other long term (current) drug therapy: Secondary | ICD-10-CM

## 2015-03-14 DIAGNOSIS — C220 Liver cell carcinoma: Secondary | ICD-10-CM | POA: Diagnosis not present

## 2015-03-14 DIAGNOSIS — Z87891 Personal history of nicotine dependence: Secondary | ICD-10-CM

## 2015-03-14 DIAGNOSIS — R188 Other ascites: Secondary | ICD-10-CM | POA: Diagnosis not present

## 2015-03-14 NOTE — Progress Notes (Unsigned)
PSN met with patient and his daughters.  Patient has gotten behind on his rent and is facing eviction if balance is not paid.  PSN will have balance paid through the [facility].  PSN gave patient samples of boost nutrition supplement due to drastic weight loss.

## 2015-03-15 ENCOUNTER — Ambulatory Visit
Admission: RE | Admit: 2015-03-15 | Discharge: 2015-03-15 | Disposition: A | Discharge status: Home / Self Care | Payer: Medicaid Other | Source: Ambulatory Visit | Attending: Oncology | Admitting: Oncology

## 2015-03-15 DIAGNOSIS — C220 Liver cell carcinoma: Secondary | ICD-10-CM

## 2015-03-15 DIAGNOSIS — R188 Other ascites: Secondary | ICD-10-CM | POA: Diagnosis not present

## 2015-04-01 NOTE — Progress Notes (Signed)
[facility]  [phone] [phone]  ID: Jeorge Reister Whipple OB: 1951/06/21  MR#: [id number]  WEX#:[id number]  Patient Care Team: [name], MD as PCP - General (Internal Medicine)  CHIEF COMPLAINT:  Chief Complaint  Patient presents with  . PT Initial Evaluation    Hepatocellular    INTERVAL HISTORY: Patient is a 64 year old male who was recently diagnosed with extensive hepatocellular carcinoma and has recently enrolled in hospice. He is referred to clinic today for evaluation and consideration of palliative paracenteses. Currently he is significantly weak and fatigued with decreased performance status. He has some abdominal discomfort, but does not complain of pain. He has no neurologic complaints. He denies any chest pain. He has a poor appetite but denies any nausea, vomiting, constipation, or diarrhea. He has no urinary complaints. Patient offers no further specific complaints today.  REVIEW OF SYSTEMS:   Review of Systems  Constitutional: Positive for weight loss and malaise/fatigue. Negative for fever.  Respiratory: Positive for shortness of breath.   Cardiovascular: Positive for leg swelling.  Gastrointestinal: Positive for abdominal pain.  Musculoskeletal: Negative.   Neurological: Positive for weakness.    As per HPI. Otherwise, a complete review of systems is negatve.  PAST MEDICAL HISTORY: Past Medical History  Diagnosis Date  . Liver cancer   . Liver cancer     PAST SURGICAL HISTORY: No past surgical history on file.  FAMILY HISTORY Family History  Problem Relation Age of Onset  . Melanoma Sister   . Throat cancer Father   . Stomach cancer Brother   . Colon cancer Brother        ADVANCED DIRECTIVES:    HEALTH MAINTENANCE: History  Substance Use Topics  . Smoking status: Former Research scientist (life sciences)  . Smokeless tobacco: Not on file  . Alcohol Use: Not on file     Colonoscopy:  PAP:  Bone density:  Lipid  panel:  No Known Allergies  Current Outpatient Prescriptions  Medication Sig Dispense Refill  . carvedilol (COREG) 6.25 MG tablet Take 6.25 mg by mouth 2 (two) times daily with a meal.    . mirtazapine (REMERON) 15 MG tablet Take 15 mg by mouth at bedtime.  0  . traMADol (ULTRAM) 50 MG tablet Take 25-50 mg by mouth every 4 (four) hours as needed for moderate pain.    . cephALEXin (KEFLEX) 500 MG capsule Take 1 capsule (500 mg total) by mouth 3 (three) times daily. (Patient not taking: Reported on 03/14/2015) 21 capsule 0  . metroNIDAZOLE (FLAGYL) 500 MG tablet Take 1 tablet (500 mg total) by mouth 3 (three) times daily. (Patient not taking: Reported on 03/14/2015) 30 tablet 0  . nadolol (CORGARD) 20 MG tablet Take 1 tablet (20 mg total) by mouth daily. (Patient not taking: Reported on 03/14/2015) 30 tablet 0   No current facility-administered medications for this visit.    OBJECTIVE: Filed Vitals:   03/14/15 0942  BP: 115/79  Pulse: 76  Temp: 96.1 F (35.6 C)  Resp: 18     There is no weight on file to calculate BMI.    ECOG FS:3 - Symptomatic, >50% confined to bed  General: Cachectic, no acute distress. Eyes: Pink conjunctiva, anicteric sclera. HEENT: Normocephalic, moist mucous membranes, clear oropharnyx. Lungs: Clear to auscultation bilaterally. Heart: Regular rate and rhythm. No rubs, murmurs, or gallops. Abdomen: Soft, distended, mild tenderness to palpation. Musculoskeletal: No edema, cyanosis, or clubbing. Neuro: Alert, answering all questions appropriately. Cranial nerves grossly intact. Skin: No rashes  or petechiae noted. Psych: Normal affect.   LAB RESULTS:  Lab Results  Component Value Date   NA 127* 02/19/2015   K 4.1 02/19/2015   CL 106 02/19/2015   CO2 16* 02/19/2015   GLUCOSE 106* 02/19/2015   BUN 20 02/19/2015   CREATININE 0.85 02/19/2015   CALCIUM 7.7* 02/19/2015   PROT 7.7 02/16/2015   ALBUMIN 1.5* 02/16/2015   AST 128* 02/16/2015   ALT 24  02/16/2015   ALKPHOS 199* 02/16/2015   BILITOT 8.0* 02/16/2015   GFRNONAA >60 02/19/2015   GFRAA >60 02/19/2015    Lab Results  Component Value Date   WBC 12.1* 02/19/2015   HGB 8.6* 02/19/2015   HCT 25.6* 02/19/2015   MCV 93.4 02/19/2015   PLT 197 02/19/2015     STUDIES: US Paracentesis  03/15/2015   CLINICAL DATA:  Hepatocellular carcinoma, ascites  EXAM: ULTRASOUND GUIDED PARACENTESIS  TECHNIQUE: The procedure, risks (including but not limited to bleeding, infection, organ damage ), benefits, and alternatives were explained to the patient. Questions regarding the procedure were encouraged and answered. The patient understands and consents to the procedure. Survey ultrasound of the abdomen was performed and an appropriate skin entry site in the right lower abdomen was selected. Skin site was marked, prepped with chlorhexidine, and draped in usual sterile fashion, and infiltrated locally with 1% lidocaine. A Safe-T-Centesis needle was advanced into the peritoneal space until fluid could be aspirated. The sheath was advanced and the needle removed. 3.35 L of yellowish greenascites were aspirated.  COMPLICATIONS: COMPLICATIONS none  IMPRESSION: Technically successful ultrasound guided paracentesis, removing 3.35 L of ascites.   Electronically Signed   By: Lucrezia Europe M.D.   On: 03/15/2015 16:13    ASSESSMENT: Advanced hepatocellular carcinoma on hospice.  PLAN:    1. HCC: Previously was determined that patient could not tolerate treatment and he has recently enrolled in hospice. He has been referred to the [place] for evaluation and consideration of palliative paracentesis. This has been ordered and greater than 3 L of fluid was removed. Patient will likely need maintenance palliative paracentesis while he is on hospice. He has been instructed to call the clinic when he feels his abdominal distention is getting worse at which time we can set up an outpatient paracentesis. He does not  need to follow-up in the [facility]. No further interventions are needed at this time. 2. Hyponatremia: Secondary to malignancy. 3. Pain: Treatment per hospice.  Greater than 45 minutes was spent in discussion and consultation.  Patient expressed understanding and was in agreement with this plan. He also understands that He can call clinic at any time with any questions, concerns, or complaints.    Lloyd Huger, MD   04/01/2015 11:05 AM

## 2015-04-29 DEATH — deceased

## 2015-11-15 IMAGING — CR DG PELVIS 1-2V
1 series · 1 of 1 positions shown · non-contrast
Comparison: Radiograph dated 07/05/2011

CLINICAL DATA: Left hip pain secondary to a fall at home today.

EXAM:
PELVIS - 1-2 VIEW

[dg pelvis 1-2 views]
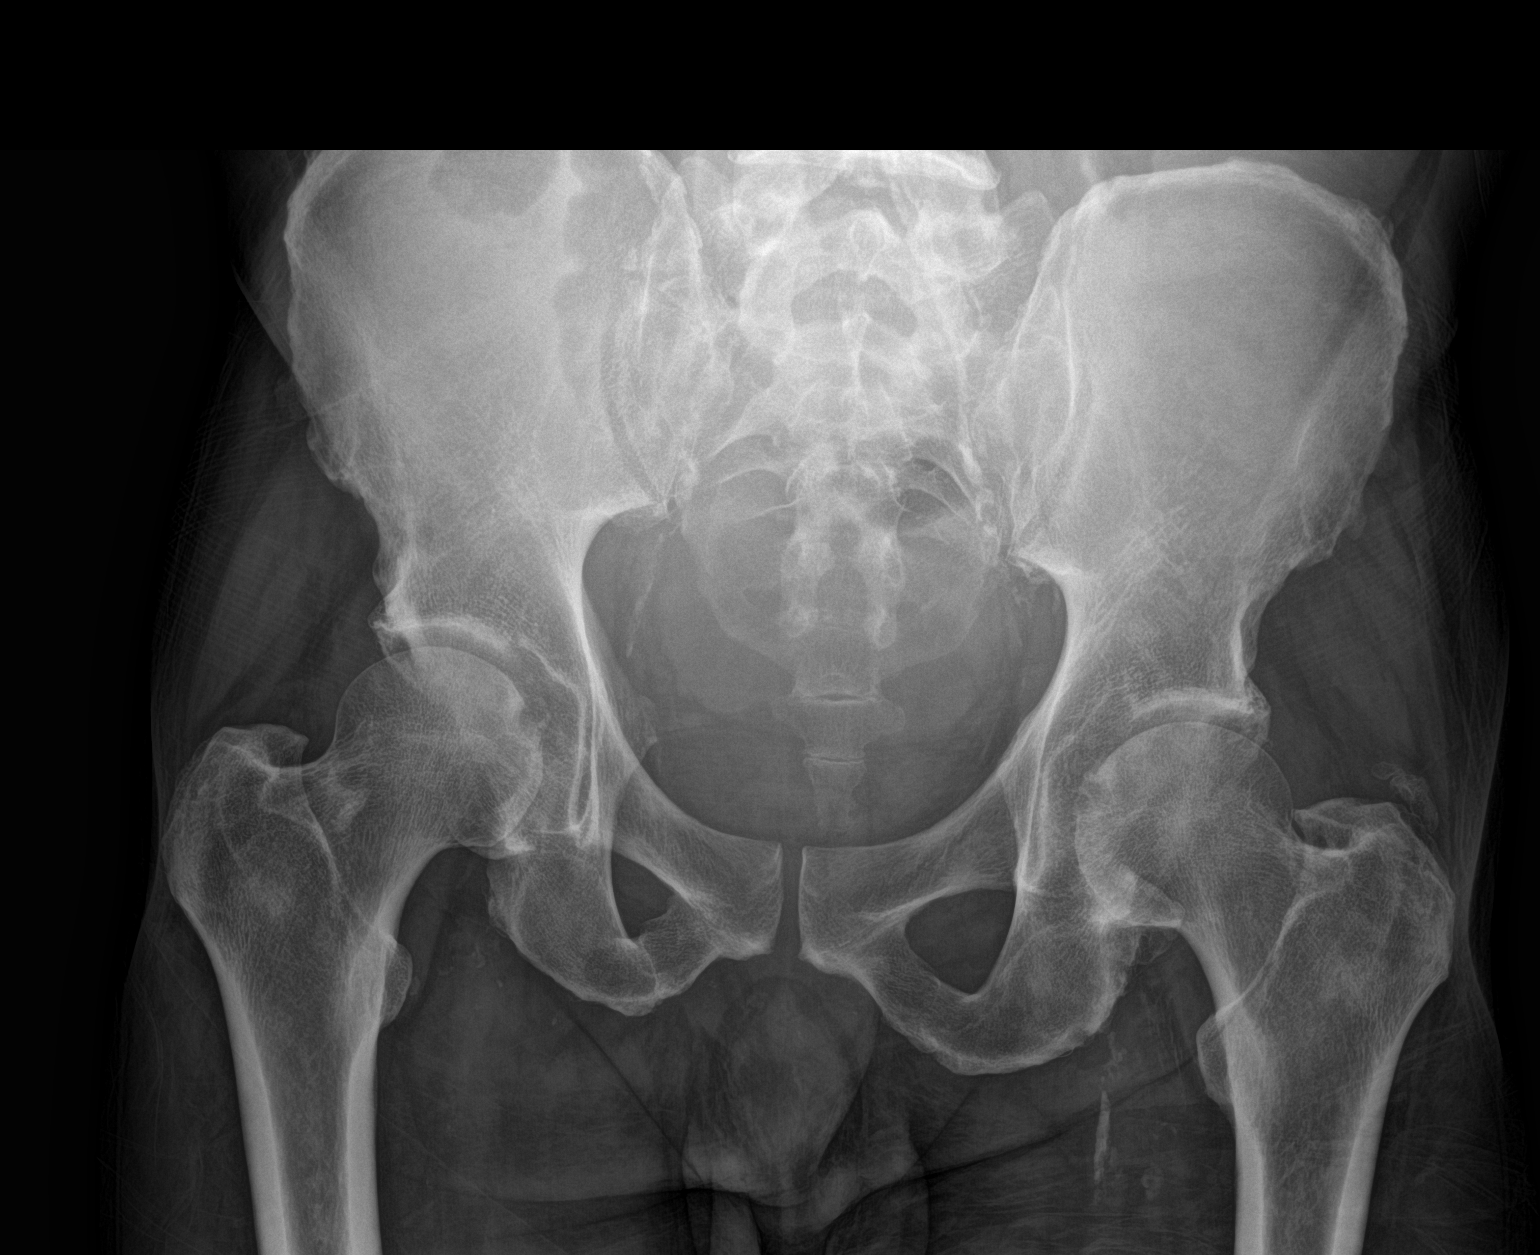

[1 of 1 positions shown; findings below may reference images not displayed]

FINDINGS: There is no fracture or dislocation. Calcifications in the distal
left gluteus medius tendon above the greater trochanter of the
proximal left femur. Extensive arterial calcification in the pelvis.
Bullet in the L4 vertebral body, unchanged.
IMPRESSION: No acute abnormalities.
